# Patient Record
Sex: Female | Born: 1995 | Race: Black or African American | Hispanic: No | Marital: Single | Smoking: Current every day smoker
Health system: Southern US, Community
[De-identification: ages and names within clinical notes are randomized; demographics above are authoritative.]

## PROBLEM LIST (undated history)

## (undated) DIAGNOSIS — J45909 Unspecified asthma, uncomplicated: Secondary | ICD-10-CM

## (undated) DIAGNOSIS — R1115 Cyclical vomiting syndrome unrelated to migraine: Secondary | ICD-10-CM

## (undated) HISTORY — PX: FOOT SURGERY: SHX648

---
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
## 2017-03-27 ENCOUNTER — Emergency Department (HOSPITAL_BASED_OUTPATIENT_CLINIC_OR_DEPARTMENT_OTHER)
Admission: EM | Admit: 2017-03-27 | Discharge: 2017-03-27 | Disposition: A | Discharge status: Home / Self Care | Payer: Self-pay | Attending: Emergency Medicine | Admitting: Emergency Medicine

## 2017-03-27 ENCOUNTER — Other Ambulatory Visit: Payer: Self-pay

## 2017-03-27 ENCOUNTER — Encounter (HOSPITAL_BASED_OUTPATIENT_CLINIC_OR_DEPARTMENT_OTHER): Payer: Self-pay | Admitting: Emergency Medicine

## 2017-03-27 DIAGNOSIS — R1084 Generalized abdominal pain: Secondary | ICD-10-CM | POA: Insufficient documentation

## 2017-03-27 DIAGNOSIS — R112 Nausea with vomiting, unspecified: Secondary | ICD-10-CM | POA: Insufficient documentation

## 2017-03-27 DIAGNOSIS — F17290 Nicotine dependence, other tobacco product, uncomplicated: Secondary | ICD-10-CM | POA: Insufficient documentation

## 2017-03-27 DIAGNOSIS — J45909 Unspecified asthma, uncomplicated: Secondary | ICD-10-CM | POA: Insufficient documentation

## 2017-03-27 DIAGNOSIS — R109 Unspecified abdominal pain: Secondary | ICD-10-CM

## 2017-03-27 LAB — URINALYSIS, MICROSCOPIC (REFLEX)

## 2017-03-27 LAB — HCG, QUANTITATIVE, PREGNANCY: hCG, Beta Chain, Quant, S: <1 m[IU]/mL (ref <5)

## 2017-03-27 LAB — URINALYSIS, ROUTINE W REFLEX MICROSCOPIC
GLUCOSE, UA: NEGATIVE mg/dL
KETONES UR: 15 mg/dL — AB
Nitrite: NEGATIVE
PROTEIN: NEGATIVE mg/dL
Specific Gravity, Urine: >1.03 — ABNORMAL HIGH (ref 1.005–1.030)
pH: 5.5 (ref 5.0–8.0)

## 2017-03-27 LAB — COMPREHENSIVE METABOLIC PANEL
ALT: 15 U/L (ref 14–54)
AST: 25 U/L (ref 15–41)
Albumin: 4.1 g/dL (ref 3.5–5.0)
Alkaline Phosphatase: 56 U/L (ref 38–126)
Anion gap: 9 (ref 5–15)
BUN: 9 mg/dL (ref 6–20)
CHLORIDE: 107 mmol/L (ref 101–111)
CO2: 23 mmol/L (ref 22–32)
CREATININE: 0.83 mg/dL (ref 0.44–1.00)
Calcium: 9.9 mg/dL (ref 8.9–10.3)
GFR calc Af Amer: >60 mL/min (ref >60)
Glucose, Bld: 110 mg/dL — ABNORMAL HIGH (ref 65–99)
Potassium: 3.4 mmol/L — ABNORMAL LOW (ref 3.5–5.1)
SODIUM: 139 mmol/L (ref 135–145)
Total Bilirubin: 0.8 mg/dL (ref 0.3–1.2)
Total Protein: 8.3 g/dL — ABNORMAL HIGH (ref 6.5–8.1)

## 2017-03-27 LAB — CBC
HCT: 39.9 % (ref 36.0–46.0)
Hemoglobin: 13.2 g/dL (ref 12.0–15.0)
MCH: 28.4 pg (ref 26.0–34.0)
MCHC: 33.1 g/dL (ref 30.0–36.0)
MCV: 86 fL (ref 78.0–100.0)
PLATELETS: 324 10*3/uL (ref 150–400)
RBC: 4.64 MIL/uL (ref 3.87–5.11)
RDW: 12.9 % (ref 11.5–15.5)
WBC: 8.8 10*3/uL (ref 4.0–10.5)

## 2017-03-27 LAB — LIPASE, BLOOD: LIPASE: 36 U/L (ref 11–51)

## 2017-03-27 MED ORDER — ONDANSETRON 4 MG PO TBDP: 4.0000 mg | ORAL_TABLET | Freq: Once | ORAL | Status: DC | PRN

## 2017-03-27 MED ORDER — ONDANSETRON HCL 4 MG/2ML IJ SOLN
4.0000 mg | Freq: Once | INTRAMUSCULAR | Status: AC | PRN
  Administered 2017-03-27: 4 mg via INTRAVENOUS
  Filled 2017-03-27: qty 2

## 2017-03-27 MED ORDER — KETOROLAC TROMETHAMINE 30 MG/ML IJ SOLN
30.0000 mg | Freq: Once | INTRAMUSCULAR | Status: AC
  Administered 2017-03-27: 30 mg via INTRAVENOUS
  Filled 2017-03-27: qty 1

## 2017-03-27 MED ORDER — METOCLOPRAMIDE HCL 5 MG/ML IJ SOLN
5.0000 mg | Freq: Once | INTRAMUSCULAR | Status: AC
  Administered 2017-03-27: 5 mg via INTRAVENOUS
  Filled 2017-03-27: qty 2

## 2017-03-27 MED ORDER — ONDANSETRON 4 MG PO TBDP: 4.0000 mg | ORAL_TABLET | Freq: Three times a day (TID) | ORAL | 0 refills | Status: DC | PRN

## 2017-03-27 MED ORDER — HALOPERIDOL LACTATE 5 MG/ML IJ SOLN
1.0000 mg | Freq: Once | INTRAMUSCULAR | Status: AC
  Administered 2017-03-27: 1 mg via INTRAVENOUS
  Filled 2017-03-27: qty 1

## 2017-03-27 MED ORDER — NAPROXEN 500 MG PO TABS: 500.0000 mg | ORAL_TABLET | Freq: Two times a day (BID) | ORAL | 0 refills | Status: DC

## 2017-03-27 NOTE — ED Notes (Signed)
Spoke to lab will add culture

## 2017-03-27 NOTE — ED Provider Notes (Signed)
[facility] EMERGENCY DEPARTMENT Provider Note   CSN: [id number] Arrival date & time: 03/27/17  1319     History   Chief Complaint Chief Complaint  Patient presents with  . Abdominal Pain  . Emesis    HPI Sherry Powell is a 22 y.o. female with a past medical history of asthma, who presents to ED for evaluation of generalized abdominal pain, 10+ episodes of nausea and vomiting that began when she woke up this morning.  She did not take any medications at home to help with symptoms.  She took 3 pills of ecstasy last night at a party and is concerned that "I am going to die."  She reports cough which also causes her to be more nauseous.  She denies any urinary symptoms, bowel changes, vaginal discharge, abnormal vaginal bleeding or previous history of similar symptoms.  She denies any other drug use, alcohol or tobacco use.  HPI  Past Medical History:  Diagnosis Date  . Asthma     There are no active problems to display for this patient.   Past Surgical History:  Procedure Laterality Date  . FOOT SURGERY      OB History    Gravida Para Term Preterm AB Living   1             SAB TAB Ectopic Multiple Live Births                   Home Medications    Prior to Admission medications   Medication Sig Start Date End Date Taking? Authorizing Provider  cephALEXin (KEFLEX) 500 MG capsule Take 1 capsule (500 mg total) by mouth 2 (two) times daily. Patient not taking: Reported on 01/28/2016 01/25/15   Tilden Fossa, MD  cyclobenzaprine (FLEXERIL) 10 MG tablet Take 1 tablet (10 mg total) by mouth 2 (two) times daily as needed for muscle spasms. 01/29/16   Alvira Monday, MD  ibuprofen (ADVIL,MOTRIN) 400 MG tablet Take 1 tablet (400 mg total) by mouth every 6 (six) hours as needed. 01/29/16   Alvira Monday, MD  naproxen (NAPROSYN) 500 MG tablet Take 1 tablet (500 mg total) by mouth 2 (two) times daily. 03/27/17   Arda Keadle, PA-C  ondansetron (ZOFRAN ODT)  4 MG disintegrating tablet Take 1 tablet (4 mg total) by mouth every 8 (eight) hours as needed for nausea or vomiting. 03/27/17   Yarenis Cerino, PA-C  ondansetron (ZOFRAN) 4 MG tablet Take 1 tablet (4 mg total) by mouth every 6 (six) hours. Patient not taking: Reported on 01/28/2016 01/25/15   Tilden Fossa, MD    Family History No family history on file.  Social History Social History   Tobacco Use  . Smoking status: Current Every Day Smoker    Types: Cigars  . Smokeless tobacco: Never Used  Substance Use Topics  . Alcohol use: No  . Drug use: No     Allergies   Strawberry (diagnostic)   Review of Systems Review of Systems  Constitutional: Negative for appetite change, chills and fever.  HENT: Negative for ear pain, rhinorrhea, sneezing and sore throat.   Eyes: Negative for photophobia and visual disturbance.  Respiratory: Negative for cough, chest tightness, shortness of breath and wheezing.   Cardiovascular: Negative for chest pain and palpitations.  Gastrointestinal: Positive for abdominal pain, nausea and vomiting. Negative for blood in stool, constipation and diarrhea.  Genitourinary: Negative for dysuria, hematuria and urgency.  Musculoskeletal: Negative for myalgias.  Skin: Negative for rash.  Neurological:  Negative for dizziness, weakness and light-headedness.     Physical Exam Updated Vital Signs BP 105/78 (BP Location: Right Arm)   Pulse (!) 58   Temp 97.9 F (36.6 C) (Oral)   Resp 20   Ht 5\' 6"  (1.676 m)   Wt 77.1 kg (170 lb)   LMP 03/23/2017   SpO2 100%   Breastfeeding? No   BMI 27.44 kg/m   Physical Exam  Constitutional: She appears well-developed and well-nourished. No distress.  Patient very anxious concerned that she is going to die.  She is also tearful saying that "please put some more Zofran in my IV."  Rolling around on exam bed.  HENT:  Head: Normocephalic and atraumatic.  Nose: Nose normal.  Eyes: Conjunctivae and EOM are normal.  Left eye exhibits no discharge. No scleral icterus.  Neck: Normal range of motion. Neck supple.  Cardiovascular: Normal rate, regular rhythm, normal heart sounds and intact distal pulses. Exam reveals no gallop and no friction rub.  No murmur heard. Pulmonary/Chest: Effort normal and breath sounds normal. No respiratory distress.  Abdominal: Soft. Bowel sounds are normal. She exhibits no distension. There is no tenderness. There is no guarding.    Generalized abdominal tenderness to palpation.  Musculoskeletal: Normal range of motion. She exhibits no edema.  Neurological: She is alert. She exhibits normal muscle tone. Coordination normal.  Skin: Skin is warm and dry. No rash noted.  Psychiatric: She has a normal mood and affect.  Nursing note and vitals reviewed.    ED Treatments / Results  Labs (all labs ordered are listed, but only abnormal results are displayed) Labs Reviewed  COMPREHENSIVE METABOLIC PANEL - Abnormal; Notable for the following components:      Result Value   Potassium 3.4 (*)    Glucose, Bld 110 (*)    Total Protein 8.3 (*)    All other components within normal limits  URINALYSIS, ROUTINE W REFLEX MICROSCOPIC - Abnormal; Notable for the following components:   APPearance CLOUDY (*)    Specific Gravity, Urine >1.030 (*)    Hgb urine dipstick SMALL (*)    Bilirubin Urine SMALL (*)    Ketones, ur 15 (*)    Leukocytes, UA SMALL (*)    All other components within normal limits  URINALYSIS, MICROSCOPIC (REFLEX) - Abnormal; Notable for the following components:   Bacteria, UA MANY (*)    Squamous Epithelial / LPF 6-30 (*)    All other components within normal limits  URINE CULTURE  LIPASE, BLOOD  CBC  HCG, QUANTITATIVE, PREGNANCY    EKG  EKG Interpretation None       Radiology No results found.  Procedures Procedures (including critical care time)  Medications Ordered in ED Medications  ondansetron (ZOFRAN) injection 4 mg (4 mg Intravenous  Given 03/27/17 1340)  haloperidol lactate (HALDOL) injection 1 mg (1 mg Intravenous Given 03/27/17 1431)  metoCLOPramide (REGLAN) injection 5 mg (5 mg Intravenous Given 03/27/17 1500)  ketorolac (TORADOL) 30 MG/ML injection 30 mg (30 mg Intravenous Given 03/27/17 1634)     Initial Impression / Assessment and Plan / ED Course  I have reviewed the triage vital signs and the nursing notes.  Pertinent labs & imaging results that were available during my care of the patient were reviewed by me and considered in my medical decision making (see chart for details).  Clinical Course as of Mar 27 1701  Wynelle Link Mar 27, 2017  1652 Toradol given. Improvement in pain. Able to tolerate PO intake.  [  HK]    Clinical Course User Index [HK] Dietrich PatesKhatri, Jarius Dieudonne, PA-C    Patient presents to ED for evaluation of general morning.  She admits that she took 3 pills of ecstasy last night at a party and is concerned that "I am going to die."  She also reports cough which causes her to be more nauseous.  She denies any urinary symptoms, diarrhea or constipation, vaginal discharge previous history of similar symptoms.  She denies any other drug use.  On physical exam during my initial evaluation patient appeared extremely anxious and would not sit still.  She kept crying that she was concerned that she was going to die because of the drugs that she took last night.  She reports generalized abdominal tenderness to palpation.  However, her vital signs are reassuring.  She is afebrile and is not tachycardic or tachypneic.  Lab work is also reassuring including CBC, CMP, lipase.  HCG was negative.  Urinalysis with small leukocytes, many bacteria and 6-30 WBCs.  Patient denies any urinary symptoms at this time, so we will send for culture and refrain from antibiotic at this time.  Patient given Zofran initially for nausea.  She continued to dry heave and vomit.  She was then given Haldol and Reglan with improvement in her nausea.  She was given  Toradol with improvement in her abdominal pain.  It appears that the half-life of ecstasy is approximately 4-6 hours so I doubt that this is a reaction.  I think this is more due to her anxiety from taking the drugs last night and worrying that they would have a harmful effect on her body.  I did encourage her to discontinue any drug use in the future.  She is able to tolerate p.o. intake here in the ED. I have low suspicion for acute intraabdominal surgical or emergent cause of her symptoms. Will discharge with Zofran and naproxen to be taken as needed. Patient appears stable for discharge at this time. Strict return precautions given.  Final Clinical Impressions(s) / ED Diagnoses   Final diagnoses:  Abdominal wall pain  Non-intractable vomiting with nausea, unspecified vomiting type    ED Discharge Orders        Ordered    ondansetron (ZOFRAN ODT) 4 MG disintegrating tablet  Every 8 hours PRN     03/27/17 1702    naproxen (NAPROSYN) 500 MG tablet  2 times daily     03/27/17 1702     Portions of this note were generated with Dragon dictation software. Dictation errors may occur despite best attempts at proofreading.    Dietrich PatesKhatri, Yeva Bissette, PA-C 03/27/17 1710    Pricilla LovelessGoldston, Scott, MD 03/28/17 [id number]

## 2017-03-27 NOTE — ED Notes (Signed)
Pt given d/c instructions as per chart. Rx x 2. Verbalizes understanding. No questions. 

## 2017-03-27 NOTE — ED Notes (Addendum)
Hollering loudly, states," I'm going to die and I am so sick to my stomach" reassurance given, informed that ED provider would be in shortly to eval. Spent time at length to calm and reassure but informed that hollering loudly would not be tolerated in ED

## 2017-03-27 NOTE — ED Triage Notes (Signed)
Pt c/o abdominal pain with N/V onset this morning when she woke. Pt took 3 pills of ectasy last night.

## 2017-03-27 NOTE — Discharge Instructions (Signed)
Please read attached information regarding your condition. Take Zofran as needed for nausea. Take naproxen as needed for pain. We will contact you with the results of your urine culture when available. Return to ED for worsening symptoms, severe abdominal pain, increased vomiting, lightheadedness or loss of consciousness.

## 2017-03-29 LAB — URINE CULTURE

## 2017-06-14 ENCOUNTER — Encounter (HOSPITAL_BASED_OUTPATIENT_CLINIC_OR_DEPARTMENT_OTHER): Payer: Self-pay | Admitting: *Deleted

## 2017-06-14 ENCOUNTER — Emergency Department (HOSPITAL_BASED_OUTPATIENT_CLINIC_OR_DEPARTMENT_OTHER): Payer: Self-pay

## 2017-06-14 ENCOUNTER — Other Ambulatory Visit: Payer: Self-pay

## 2017-06-14 ENCOUNTER — Emergency Department (HOSPITAL_BASED_OUTPATIENT_CLINIC_OR_DEPARTMENT_OTHER)
Admission: EM | Admit: 2017-06-14 | Discharge: 2017-06-14 | Discharge status: Against Medical Advice | Payer: Self-pay | Attending: Emergency Medicine | Admitting: Emergency Medicine

## 2017-06-14 DIAGNOSIS — F17290 Nicotine dependence, other tobacco product, uncomplicated: Secondary | ICD-10-CM | POA: Insufficient documentation

## 2017-06-14 DIAGNOSIS — K529 Noninfective gastroenteritis and colitis, unspecified: Secondary | ICD-10-CM | POA: Insufficient documentation

## 2017-06-14 DIAGNOSIS — J45909 Unspecified asthma, uncomplicated: Secondary | ICD-10-CM | POA: Insufficient documentation

## 2017-06-14 LAB — COMPREHENSIVE METABOLIC PANEL
ALBUMIN: 4 g/dL (ref 3.5–5.0)
ALT: 10 U/L — ABNORMAL LOW (ref 14–54)
AST: 18 U/L (ref 15–41)
Alkaline Phosphatase: 58 U/L (ref 38–126)
Anion gap: 9 (ref 5–15)
BUN: 10 mg/dL (ref 6–20)
CO2: 21 mmol/L — AB (ref 22–32)
Calcium: 9.1 mg/dL (ref 8.9–10.3)
Chloride: 107 mmol/L (ref 101–111)
Creatinine, Ser: 0.82 mg/dL (ref 0.44–1.00)
GFR calc Af Amer: >60 mL/min (ref >60)
GFR calc non Af Amer: >60 mL/min (ref >60)
GLUCOSE: 102 mg/dL — AB (ref 65–99)
POTASSIUM: 3.9 mmol/L (ref 3.5–5.1)
SODIUM: 137 mmol/L (ref 135–145)
Total Bilirubin: 0.5 mg/dL (ref 0.3–1.2)
Total Protein: 7.7 g/dL (ref 6.5–8.1)

## 2017-06-14 LAB — PREGNANCY, URINE: Preg Test, Ur: NEGATIVE

## 2017-06-14 LAB — CBC
HCT: 38.6 % (ref 36.0–46.0)
Hemoglobin: 12.9 g/dL (ref 12.0–15.0)
MCH: 29.3 pg (ref 26.0–34.0)
MCHC: 33.4 g/dL (ref 30.0–36.0)
MCV: 87.7 fL (ref 78.0–100.0)
Platelets: 272 10*3/uL (ref 150–400)
RBC: 4.4 MIL/uL (ref 3.87–5.11)
RDW: 13.1 % (ref 11.5–15.5)
WBC: 11.1 10*3/uL — AB (ref 4.0–10.5)

## 2017-06-14 LAB — URINALYSIS, ROUTINE W REFLEX MICROSCOPIC
Bilirubin Urine: NEGATIVE
Glucose, UA: NEGATIVE mg/dL
Hgb urine dipstick: NEGATIVE
KETONES UR: NEGATIVE mg/dL
Nitrite: NEGATIVE
Protein, ur: NEGATIVE mg/dL
Specific Gravity, Urine: >1.03 — ABNORMAL HIGH (ref 1.005–1.030)
pH: 6 (ref 5.0–8.0)

## 2017-06-14 LAB — LIPASE, BLOOD: LIPASE: 47 U/L (ref 11–51)

## 2017-06-14 LAB — URINALYSIS, MICROSCOPIC (REFLEX)

## 2017-06-14 MED ORDER — IOPAMIDOL (ISOVUE-300) INJECTION 61%
100.0000 mL | Freq: Once | INTRAVENOUS | Status: AC | PRN
  Administered 2017-06-14: 100 mL via INTRAVENOUS

## 2017-06-14 MED ORDER — PROMETHAZINE HCL 25 MG/ML IJ SOLN: 25.0000 mg | Freq: Once | INTRAMUSCULAR | Status: DC

## 2017-06-14 MED ORDER — ONDANSETRON HCL 4 MG/2ML IJ SOLN
INTRAMUSCULAR | Status: AC
  Administered 2017-06-14: 4 mg
  Filled 2017-06-14: qty 2

## 2017-06-14 MED ORDER — SODIUM CHLORIDE 0.9 % IV BOLUS: 1000.0000 mL | Freq: Once | INTRAVENOUS | Status: DC

## 2017-06-14 MED ORDER — ONDANSETRON 4 MG PO TBDP
4.0000 mg | ORAL_TABLET | Freq: Once | ORAL | Status: DC | PRN
  Filled 2017-06-14: qty 1

## 2017-06-14 NOTE — ED Triage Notes (Signed)
Abdominal pain woke her at 6am. Vomiting. She is pacing and crying. Does not know if she is pregnant.

## 2017-06-14 NOTE — ED Notes (Addendum)
Received call from registration, patient was seen leaving the building and getting into her car.  Notified EDP

## 2017-06-14 NOTE — ED Provider Notes (Signed)
[facility] EMERGENCY DEPARTMENT Provider Note   CSN: [id number] Arrival date & time: 06/14/17  1139     History   Chief Complaint Chief Complaint  Patient presents with  . Abdominal Pain  . Emesis    HPI Neela Zecca is a 22 y.o. female history of asthma here presenting with abdominal pain, vomiting.  Patient woke up around 6 AM with several episodes of vomiting.  Denies any diarrhea, does have some more right-sided abdominal pain as well.  Denies any recent travel or sick contacts.  The history is provided by the patient.    Past Medical History:  Diagnosis Date  . Asthma     There are no active problems to display for this patient.   Past Surgical History:  Procedure Laterality Date  . FOOT SURGERY       OB History    Gravida  1   Para      Term      Preterm      AB      Living        SAB      TAB      Ectopic      Multiple      Live Births               Home Medications    Prior to Admission medications   Medication Sig Start Date End Date Taking? Authorizing Provider  cephALEXin (KEFLEX) 500 MG capsule Take 1 capsule (500 mg total) by mouth 2 (two) times daily. Patient not taking: Reported on 01/28/2016 01/25/15   Tilden Fossa, MD  cyclobenzaprine (FLEXERIL) 10 MG tablet Take 1 tablet (10 mg total) by mouth 2 (two) times daily as needed for muscle spasms. 01/29/16   Alvira Monday, MD  ibuprofen (ADVIL,MOTRIN) 400 MG tablet Take 1 tablet (400 mg total) by mouth every 6 (six) hours as needed. 01/29/16   Alvira Monday, MD  naproxen (NAPROSYN) 500 MG tablet Take 1 tablet (500 mg total) by mouth 2 (two) times daily. 03/27/17   Khatri, Hina, PA-C  ondansetron (ZOFRAN ODT) 4 MG disintegrating tablet Take 1 tablet (4 mg total) by mouth every 8 (eight) hours as needed for nausea or vomiting. 03/27/17   Khatri, Hina, PA-C  ondansetron (ZOFRAN) 4 MG tablet Take 1 tablet (4 mg total) by mouth every 6 (six) hours. Patient  not taking: Reported on 01/28/2016 01/25/15   Tilden Fossa, MD    Family History No family history on file.  Social History Social History   Tobacco Use  . Smoking status: Current Every Day Smoker    Types: Cigars  . Smokeless tobacco: Never Used  Substance Use Topics  . Alcohol use: No  . Drug use: No     Allergies   Strawberry (diagnostic)   Review of Systems Review of Systems  Gastrointestinal: Positive for abdominal pain and vomiting.  All other systems reviewed and are negative.    Physical Exam Updated Vital Signs BP 108/68 (BP Location: Left Arm)   Pulse 64   Temp 98 F (36.7 C) (Oral)   Resp 16   Ht 5\' 6"  (1.676 m)   Wt 79.4 kg (175 lb)   LMP 05/18/2017   SpO2 100%   BMI 28.25 kg/m   Physical Exam  Constitutional:  Slightly dehydrated   HENT:  Head: Normocephalic.  MM slightly dry   Eyes: Pupils are equal, round, and reactive to light. EOM are normal.  Cardiovascular: Normal rate.  Pulmonary/Chest: Effort normal.  Abdominal: Normal appearance and bowel sounds are normal. There is tenderness in the right lower quadrant.  Neurological: She is alert.  Skin: Skin is warm. Capillary refill takes less than 2 seconds.  Psychiatric: She has a normal mood and affect.  Nursing note and vitals reviewed.    ED Treatments / Results  Labs (all labs ordered are listed, but only abnormal results are displayed) Labs Reviewed  COMPREHENSIVE METABOLIC PANEL - Abnormal; Notable for the following components:      Result Value   CO2 21 (*)    Glucose, Bld 102 (*)    ALT 10 (*)    All other components within normal limits  CBC - Abnormal; Notable for the following components:   WBC 11.1 (*)    All other components within normal limits  URINALYSIS, ROUTINE W REFLEX MICROSCOPIC - Abnormal; Notable for the following components:   APPearance HAZY (*)    Specific Gravity, Urine >1.030 (*)    Leukocytes, UA TRACE (*)    All other components within normal  limits  URINALYSIS, MICROSCOPIC (REFLEX) - Abnormal; Notable for the following components:   Bacteria, UA MANY (*)    Squamous Epithelial / LPF 6-30 (*)    All other components within normal limits  LIPASE, BLOOD  PREGNANCY, URINE    EKG None  Radiology Ct Abdomen Pelvis W Contrast  Result Date: 06/14/2017 CLINICAL DATA:  Abdominal pain acute general. EXAM: CT ABDOMEN AND PELVIS WITH CONTRAST TECHNIQUE: Multidetector CT imaging of the abdomen and pelvis was performed using the standard protocol following bolus administration of intravenous contrast. CONTRAST:  ISOVUE-300 IOPAMIDOL (ISOVUE-300) INJECTION 61% COMPARISON:  CT and pelvis 07/20/2016 FINDINGS: Lower chest: The lung bases are clear without airspace disease. Heart size is normal. No pleural or pericardial effusion is present. Hepatobiliary: No focal liver abnormality is seen. No gallstones, gallbladder wall thickening, or biliary dilatation. Pancreas: Unremarkable. No pancreatic ductal dilatation or surrounding inflammatory changes. Spleen: Normal in size without focal abnormality. Adrenals/Urinary Tract: Adrenal glands are normal bilaterally. Kidneys and ureters are within normal limits. The urinary bladder. Stomach/Bowel: A small hiatal hernia is present. Stomach and duodenum are otherwise within normal limits. Proximal small bowel is within normal limits. There may be some wall thickening in the distal terminal ileum. Appendix is visualized and normal. The cecum is within normal limits. There is wall thickening at hepatic flexure. No discrete mass is present. The more distal colon collapsed. Vascular/Lymphatic: Mesenteric lymph nodes are mildly prominent. There are no pathologically enlarged nodes. Significant vascular lesions are present. Reproductive: Dominant follicle is present in the left ovary. Retroverted uterus demonstrates some edema probable fluid in the endometrial canal. Please correlate with menstrual cycle. Other: Small  amount free fluid within the anatomic pelvis likely physiologic. Musculoskeletal: Bone windows are unremarkable. IMPRESSION: 1. Nonspecific wall thickening in the terminal ileum as well as the hepatic flexure of the colon raises possibility of inflammatory bowel disease. Nonspecific enteritis is also considered. Mesenteric lymph nodes compatible with inflammatory process. No discrete mass lesion or obstruction is present. 2. Fluid in the endometrial canal, prominent follicle, and free fluid suggest late menstrual cycle findings. Please correlate clinically. 3. No other acute or focal lesion to explain patient's symptoms. Electronically Signed   By: Marin Roberts M.D.   On: 06/14/2017 15:03    Procedures Procedures (including critical care time)  Medications Ordered in ED Medications  ondansetron (ZOFRAN-ODT) disintegrating tablet 4 mg (has no administration in time range)  sodium chloride 0.9 % bolus 1,000 mL (1,000 mLs Intravenous Refused 06/14/17 1406)  ondansetron (ZOFRAN) 4 MG/2ML injection (4 mg  Given 06/14/17 1239)  iopamidol (ISOVUE-300) 61 % injection 100 mL (100 mLs Intravenous Contrast Given 06/14/17 1348)     Initial Impression / Assessment and Plan / ED Course  I have reviewed the triage vital signs and the nursing notes.  Pertinent labs & imaging results that were available during my care of the patient were reviewed by me and considered in my medical decision making (see chart for details).    Tim LairStephanie Oberman is a 22 y.o. female here with RLQ pain, vomiting. Afebrile but has focal RLQ tenderness. Consider appy vs gastroenteritis. No pelvic discharge to suggest tubo ovarian abscess or torsion. Will get labs, UA, CT ab/pel.   2:20 pm Patient was seen by registration leaving the premise. Labs showed WBC 11. CT ab/pel results pending.   3:35 PM I followed up CT that showed possible enteritis vs IBS. I suspect likely enteritis which is likely viral. I do recommend GI follow  up if she returns to the ED for similar symptoms in the future. At this point, she has no further workup needed.    Final Clinical Impressions(s) / ED Diagnoses   Final diagnoses:  Gastroenteritis    ED Discharge Orders    None       Charlynne PanderYao, David Hsienta, MD 06/14/17 1537

## 2018-01-01 ENCOUNTER — Emergency Department (HOSPITAL_BASED_OUTPATIENT_CLINIC_OR_DEPARTMENT_OTHER)
Admission: EM | Admit: 2018-01-01 | Discharge: 2018-01-01 | Disposition: A | Discharge status: Home / Self Care | Payer: Self-pay | Attending: Emergency Medicine | Admitting: Emergency Medicine

## 2018-01-01 ENCOUNTER — Other Ambulatory Visit: Payer: Self-pay

## 2018-01-01 ENCOUNTER — Encounter (HOSPITAL_BASED_OUTPATIENT_CLINIC_OR_DEPARTMENT_OTHER): Payer: Self-pay | Admitting: Emergency Medicine

## 2018-01-01 DIAGNOSIS — F17210 Nicotine dependence, cigarettes, uncomplicated: Secondary | ICD-10-CM | POA: Insufficient documentation

## 2018-01-01 DIAGNOSIS — Z79899 Other long term (current) drug therapy: Secondary | ICD-10-CM | POA: Insufficient documentation

## 2018-01-01 DIAGNOSIS — R112 Nausea with vomiting, unspecified: Secondary | ICD-10-CM | POA: Insufficient documentation

## 2018-01-01 DIAGNOSIS — J45909 Unspecified asthma, uncomplicated: Secondary | ICD-10-CM | POA: Insufficient documentation

## 2018-01-01 MED ORDER — FAMOTIDINE IN NACL 20-0.9 MG/50ML-% IV SOLN
20.0000 mg | Freq: Once | INTRAVENOUS | Status: AC
  Administered 2018-01-01: 20 mg via INTRAVENOUS
  Filled 2018-01-01: qty 50

## 2018-01-01 MED ORDER — DIPHENHYDRAMINE HCL 50 MG/ML IJ SOLN
25.0000 mg | Freq: Once | INTRAMUSCULAR | Status: AC
  Administered 2018-01-01: 25 mg via INTRAVENOUS
  Filled 2018-01-01: qty 1

## 2018-01-01 MED ORDER — MORPHINE SULFATE (PF) 2 MG/ML IV SOLN
1.0000 mg | Freq: Once | INTRAVENOUS | Status: AC
  Administered 2018-01-01: 1 mg via INTRAVENOUS
  Filled 2018-01-01: qty 1

## 2018-01-01 MED ORDER — SODIUM CHLORIDE 0.9 % IV BOLUS
1000.0000 mL | Freq: Once | INTRAVENOUS | Status: AC
  Administered 2018-01-01: 1000 mL via INTRAVENOUS

## 2018-01-01 MED ORDER — ONDANSETRON HCL 4 MG PO TABS: 4.0000 mg | ORAL_TABLET | Freq: Three times a day (TID) | ORAL | 0 refills | Status: AC | PRN

## 2018-01-01 MED ORDER — ONDANSETRON HCL 4 MG/2ML IJ SOLN
4.0000 mg | Freq: Once | INTRAMUSCULAR | Status: AC
  Administered 2018-01-01: 4 mg via INTRAVENOUS
  Filled 2018-01-01: qty 2

## 2018-01-01 MED ORDER — ONDANSETRON HCL 4 MG/2ML IJ SOLN
INTRAMUSCULAR | Status: AC
  Filled 2018-01-01: qty 2

## 2018-01-01 MED ORDER — METOCLOPRAMIDE HCL 5 MG/ML IJ SOLN
10.0000 mg | Freq: Once | INTRAMUSCULAR | Status: AC
  Administered 2018-01-01: 10 mg via INTRAVENOUS
  Filled 2018-01-01: qty 2

## 2018-01-01 MED ORDER — SODIUM CHLORIDE 0.9 % IV SOLN: INTRAVENOUS | Status: DC | PRN

## 2018-01-01 MED ORDER — ONDANSETRON HCL 4 MG/2ML IJ SOLN
4.0000 mg | Freq: Once | INTRAMUSCULAR | Status: AC
  Administered 2018-01-01: 4 mg via INTRAVENOUS

## 2018-01-01 NOTE — ED Notes (Signed)
Patient begin to vomit after using the restroom.

## 2018-01-01 NOTE — ED Provider Notes (Signed)
[facility] EMERGENCY DEPARTMENT Provider Note  CSN: [id number] Arrival date & time: 01/01/18  1217  History   Chief Complaint Chief Complaint  Patient presents with  . Emesis    HPI Sherry Powell is a 22 y.o. female with a medical history of asthma who presented to the ED for   Emesis   This is a new problem. The current episode started yesterday. The problem occurs 5 to 10 times per day. The problem has not changed since onset.The emesis has an appearance of stomach contents and bilious material. There has been no fever. Pertinent negatives include no abdominal pain, no arthralgias, no chills, no diarrhea, no fever, no headaches, no myalgias, no sweats and no URI. Risk factors: Occurs in the context of excess EtOH use last night.    Past Medical History:  Diagnosis Date  . Asthma     There are no active problems to display for this patient.   Past Surgical History:  Procedure Laterality Date  . FOOT SURGERY       OB History    Gravida  1   Para      Term      Preterm      AB      Living        SAB      TAB      Ectopic      Multiple      Live Births               Home Medications    Prior to Admission medications   Medication Sig Start Date End Date Taking? Authorizing Provider  cephALEXin (KEFLEX) 500 MG capsule Take 1 capsule (500 mg total) by mouth 2 (two) times daily. Patient not taking: Reported on 01/28/2016 01/25/15   Tilden Fossa, MD  cyclobenzaprine (FLEXERIL) 10 MG tablet Take 1 tablet (10 mg total) by mouth 2 (two) times daily as needed for muscle spasms. 01/29/16   Alvira Monday, MD  ibuprofen (ADVIL,MOTRIN) 400 MG tablet Take 1 tablet (400 mg total) by mouth every 6 (six) hours as needed. 01/29/16   Alvira Monday, MD  naproxen (NAPROSYN) 500 MG tablet Take 1 tablet (500 mg total) by mouth 2 (two) times daily. 03/27/17   Khatri, Hina, PA-C  ondansetron (ZOFRAN ODT) 4 MG disintegrating tablet Take 1 tablet  (4 mg total) by mouth every 8 (eight) hours as needed for nausea or vomiting. 03/27/17   Khatri, Hina, PA-C  ondansetron (ZOFRAN) 4 MG tablet Take 1 tablet (4 mg total) by mouth every 6 (six) hours. Patient not taking: Reported on 01/28/2016 01/25/15   Tilden Fossa, MD    Family History No family history on file.  Social History Social History   Tobacco Use  . Smoking status: Current Every Day Smoker    Types: Cigars  . Smokeless tobacco: Never Used  Substance Use Topics  . Alcohol use: Yes  . Drug use: No     Allergies   Strawberry (diagnostic)   Review of Systems Review of Systems  Constitutional: Negative for chills and fever.  Gastrointestinal: Positive for vomiting. Negative for abdominal pain and diarrhea.  Genitourinary: Negative.   Musculoskeletal: Negative for arthralgias and myalgias.  Skin: Negative.   Neurological: Negative for headaches.   Physical Exam Updated Vital Signs BP 113/74 (BP Location: Right Arm)   Pulse 91   Temp 98.1 F (36.7 C)   Resp (!) 22   Ht 5\' 6"  (1.676 m)  Wt 84.4 kg   LMP 12/28/2017   SpO2 100%   BMI 30.02 kg/m   Physical Exam  Constitutional: She appears well-developed and well-nourished. No distress.  HENT:  Mouth/Throat: Uvula is midline, oropharynx is clear and moist and mucous membranes are normal.  Eyes: Pupils are equal, round, and reactive to light. Conjunctivae, EOM and lids are normal.  Cardiovascular: Normal rate, regular rhythm and normal heart sounds.  No murmur heard. Pulmonary/Chest: Effort normal and breath sounds normal.  Abdominal: Soft. Normal appearance and bowel sounds are normal. There is no tenderness.  Musculoskeletal: Normal range of motion.  Skin: Skin is warm and intact. Capillary refill takes less than 2 seconds.  Nursing note and vitals reviewed.  ED Treatments / Results  Labs (all labs ordered are listed, but only abnormal results are displayed) Labs Reviewed - No data to  display  EKG None  Radiology No results found.  Procedures Procedures (including critical care time)  Medications Ordered in ED Medications  sodium chloride 0.9 % bolus 1,000 mL (has no administration in time range)  ondansetron (ZOFRAN) injection 4 mg (has no administration in time range)   Initial Impression / Assessment and Plan / ED Course  Triage vital signs and the nursing notes have been reviewed.  Pertinent labs & imaging results that were available during care of the patient were reviewed and considered in medical decision making (see chart for details).  Patient presents to the ED afebrile for nausea and vomiting which occurs in the context of excessive EtOH consumption the prior night. Physical exam unremarkable except for some very mild tenderness with abdominal palpation. No findings that raises concern for an acute abdomen that warrants further evaluation currently. Will begin IV hydration and antiemetic for symptom management.     Final Clinical Impressions(s) / ED Diagnoses  1. Nausea/Vomiting. Nausea controlled and alleviated with IV fluids and a combination of anti-emetics including Benadryl, Zofran and Reglan. Education provided on home care following EtOH intoxication. Rx for Zofran prescribed.   Dispo: Home. After thorough clinical evaluation, this patient is determined to be medically stable and can be safely discharged with the previously mentioned treatment and/or outpatient follow-up/referral(s). At this time, there are no other apparent medical conditions that require further screening, evaluation or treatment.  Final diagnoses:  Non-intractable vomiting with nausea, unspecified vomiting type    ED Discharge Orders    None        Windy Carina, [place] 01/01/18 1458    Long, Arlyss Repress, MD 01/02/18 [phone]

## 2018-01-01 NOTE — Discharge Instructions (Addendum)
It is very important that you stay hydrated. You need to drink lots of water. I have prescribed you Zofran that you may use at home for your nausea.  Be safe and take care of yourself.

## 2018-01-01 NOTE — ED Notes (Signed)
Pt states needs to rest for another 15 minutes before discharge. Friend in room to drive

## 2018-01-01 NOTE — ED Notes (Signed)
ED Provider at bedside. 

## 2018-01-01 NOTE — ED Triage Notes (Addendum)
Pt c/o vomiting since last night. Pt was drinking alcohol yesterday.

## 2018-07-31 ENCOUNTER — Emergency Department (HOSPITAL_BASED_OUTPATIENT_CLINIC_OR_DEPARTMENT_OTHER)
Admission: EM | Admit: 2018-07-31 | Discharge: 2018-07-31 | Disposition: A | Discharge status: Home / Self Care | Payer: Self-pay | Attending: Emergency Medicine | Admitting: Emergency Medicine

## 2018-07-31 ENCOUNTER — Encounter (HOSPITAL_BASED_OUTPATIENT_CLINIC_OR_DEPARTMENT_OTHER): Payer: Self-pay

## 2018-07-31 ENCOUNTER — Other Ambulatory Visit: Payer: Self-pay

## 2018-07-31 DIAGNOSIS — F17210 Nicotine dependence, cigarettes, uncomplicated: Secondary | ICD-10-CM | POA: Insufficient documentation

## 2018-07-31 DIAGNOSIS — R1115 Cyclical vomiting syndrome unrelated to migraine: Secondary | ICD-10-CM | POA: Insufficient documentation

## 2018-07-31 DIAGNOSIS — J45909 Unspecified asthma, uncomplicated: Secondary | ICD-10-CM | POA: Insufficient documentation

## 2018-07-31 DIAGNOSIS — Z79899 Other long term (current) drug therapy: Secondary | ICD-10-CM | POA: Insufficient documentation

## 2018-07-31 LAB — COMPREHENSIVE METABOLIC PANEL
ALT: 13 U/L (ref 0–44)
AST: 20 U/L (ref 15–41)
Albumin: 4.2 g/dL (ref 3.5–5.0)
Alkaline Phosphatase: 52 U/L (ref 38–126)
Anion gap: 9 (ref 5–15)
BUN: 9 mg/dL (ref 6–20)
CO2: 22 mmol/L (ref 22–32)
Calcium: 9.4 mg/dL (ref 8.9–10.3)
Chloride: 108 mmol/L (ref 98–111)
Creatinine, Ser: 0.83 mg/dL (ref 0.44–1.00)
GFR calc Af Amer: >60 mL/min (ref >60)
GFR calc non Af Amer: >60 mL/min (ref >60)
Glucose, Bld: 109 mg/dL — ABNORMAL HIGH (ref 70–99)
Potassium: 3.4 mmol/L — ABNORMAL LOW (ref 3.5–5.1)
Sodium: 139 mmol/L (ref 135–145)
Total Bilirubin: 0.4 mg/dL (ref 0.3–1.2)
Total Protein: 7.8 g/dL (ref 6.5–8.1)

## 2018-07-31 LAB — CBC WITH DIFFERENTIAL/PLATELET
Abs Immature Granulocytes: 0.05 10*3/uL (ref 0.00–0.07)
Basophils Absolute: 0.1 10*3/uL (ref 0.0–0.1)
Basophils Relative: 0 %
Eosinophils Absolute: 0.4 10*3/uL (ref 0.0–0.5)
Eosinophils Relative: 3 %
HCT: 39.8 % (ref 36.0–46.0)
Hemoglobin: 12.1 g/dL (ref 12.0–15.0)
Immature Granulocytes: 0 %
Lymphocytes Relative: 25 %
Lymphs Abs: 3 10*3/uL (ref 0.7–4.0)
MCH: 27.8 pg (ref 26.0–34.0)
MCHC: 30.4 g/dL (ref 30.0–36.0)
MCV: 91.3 fL (ref 80.0–100.0)
Monocytes Absolute: 0.9 10*3/uL (ref 0.1–1.0)
Monocytes Relative: 8 %
Neutro Abs: 7.4 10*3/uL (ref 1.7–7.7)
Neutrophils Relative %: 64 %
Platelets: 327 10*3/uL (ref 150–400)
RBC: 4.36 MIL/uL (ref 3.87–5.11)
RDW: 13.3 % (ref 11.5–15.5)
WBC: 11.7 10*3/uL — ABNORMAL HIGH (ref 4.0–10.5)
nRBC: 0 % (ref 0.0–0.2)

## 2018-07-31 LAB — URINALYSIS, ROUTINE W REFLEX MICROSCOPIC
Bilirubin Urine: NEGATIVE
Glucose, UA: NEGATIVE mg/dL
Hgb urine dipstick: NEGATIVE
Ketones, ur: NEGATIVE mg/dL
Leukocytes,Ua: NEGATIVE
Nitrite: NEGATIVE
Protein, ur: NEGATIVE mg/dL
Specific Gravity, Urine: 1.02 (ref 1.005–1.030)
pH: 7 (ref 5.0–8.0)

## 2018-07-31 LAB — LIPASE, BLOOD: Lipase: 51 U/L (ref 11–51)

## 2018-07-31 LAB — PREGNANCY, URINE: Preg Test, Ur: NEGATIVE

## 2018-07-31 MED ORDER — CAPSAICIN 0.075 % EX CREA
TOPICAL_CREAM | Freq: Once | CUTANEOUS | Status: AC
  Administered 2018-07-31: 16:00:00 via TOPICAL
  Filled 2018-07-31: qty 60

## 2018-07-31 MED ORDER — SODIUM CHLORIDE 0.9 % IV BOLUS
1000.0000 mL | Freq: Once | INTRAVENOUS | Status: AC
  Administered 2018-07-31: 1000 mL via INTRAVENOUS

## 2018-07-31 MED ORDER — METOCLOPRAMIDE HCL 5 MG/ML IJ SOLN
10.0000 mg | Freq: Once | INTRAMUSCULAR | Status: AC
  Administered 2018-07-31: 12:00:00 10 mg via INTRAVENOUS
  Filled 2018-07-31: qty 2

## 2018-07-31 MED ORDER — ONDANSETRON HCL 4 MG/2ML IJ SOLN
4.0000 mg | Freq: Once | INTRAMUSCULAR | Status: AC
  Administered 2018-07-31: 4 mg via INTRAVENOUS

## 2018-07-31 MED ORDER — ONDANSETRON HCL 4 MG/2ML IJ SOLN
INTRAMUSCULAR | Status: AC
  Administered 2018-07-31: 4 mg via INTRAVENOUS
  Filled 2018-07-31: qty 2

## 2018-07-31 MED ORDER — ONDANSETRON HCL 4 MG/2ML IJ SOLN
4.0000 mg | Freq: Once | INTRAMUSCULAR | Status: AC
  Administered 2018-07-31: 16:00:00 4 mg via INTRAVENOUS
  Filled 2018-07-31: qty 2

## 2018-07-31 MED ORDER — DICYCLOMINE HCL 10 MG/ML IM SOLN
20.0000 mg | Freq: Once | INTRAMUSCULAR | Status: DC
  Filled 2018-07-31: qty 2

## 2018-07-31 MED ORDER — FAMOTIDINE IN NACL 20-0.9 MG/50ML-% IV SOLN
20.0000 mg | Freq: Once | INTRAVENOUS | Status: AC
  Administered 2018-07-31: 12:00:00 20 mg via INTRAVENOUS
  Filled 2018-07-31: qty 50

## 2018-07-31 MED ORDER — DIPHENHYDRAMINE HCL 50 MG/ML IJ SOLN
25.0000 mg | Freq: Once | INTRAMUSCULAR | Status: AC
Start: 2018-07-31 — End: 2018-07-31
  Administered 2018-07-31: 12:00:00 25 mg via INTRAVENOUS
  Filled 2018-07-31: qty 1

## 2018-07-31 MED ORDER — HALOPERIDOL LACTATE 5 MG/ML IJ SOLN
2.5000 mg | Freq: Once | INTRAMUSCULAR | Status: AC
  Administered 2018-07-31: 13:00:00 2.5 mg via INTRAVENOUS
  Filled 2018-07-31: qty 1

## 2018-07-31 NOTE — ED Notes (Signed)
Pt on monitor 

## 2018-07-31 NOTE — ED Triage Notes (Signed)
Pt states awoke today 6am nauseous, vomiting.  Denies recent illness or fever, does report abdominal cramping with vomiting.

## 2018-07-31 NOTE — ED Notes (Signed)
Pt continues to report intermittent nausea/vomiting, provider aware, see additional orders.

## 2018-07-31 NOTE — ED Notes (Signed)
Pt resting with eyes closed.

## 2018-07-31 NOTE — ED Notes (Signed)
Pt provided ginger ale, took a few sips, states starting to feel better.  PO challenge in progress

## 2018-07-31 NOTE — ED Notes (Signed)
Pt seen by staff walked out of ambulance bay door.  Would not return to complete discharge process.  Pt removed her own IV prior to leaving room.

## 2018-07-31 NOTE — ED Notes (Signed)
Urine specimen sent, awaiting result.  Pt reports nausea returned.  Provider made aware.

## 2018-07-31 NOTE — ED Notes (Signed)
Pt continues to report no relief from ordered medications, nausea continues.

## 2018-07-31 NOTE — ED Provider Notes (Signed)
[facility] EMERGENCY DEPARTMENT Provider Note   CSN: [id number] Arrival date & time: 07/31/18  1055    History   Chief Complaint Chief Complaint  Patient presents with   Emesis    HPI Sherry Powell is a 23 y.o. female.     Sherry Powell is a 23 y.o. female with hx of asthma, presents for evaluation of nausea and vomiting. Patient reports symptoms began at 6 AM and she has had persistent nausea and vomiting since then and has been unable to stop.  She reports some associated abdominal cramping just prior to vomiting but no persistent abdominal pain.  No associated fevers or chills.  Normal bowel movements, no diarrhea or constipation.  No associated dysuria or urinary frequency.  No vaginal bleeding or vaginal discharge.  No associated chest pain, shortness of breath or cough.  Patient initially denies any drug use or recent alcohol use but then reports marijuana use.  Chart review reveals history of similar visits in the past, with diagnosis of cyclic vomiting syndrome.     Past Medical History:  Diagnosis Date   Asthma     There are no active problems to display for this patient.   Past Surgical History:  Procedure Laterality Date   FOOT SURGERY       OB History    Gravida  1   Para      Term      Preterm      AB      Living        SAB      TAB      Ectopic      Multiple      Live Births               Home Medications    Prior to Admission medications   Medication Sig Start Date End Date Taking? Authorizing Provider  cephALEXin (KEFLEX) 500 MG capsule Take 1 capsule (500 mg total) by mouth 2 (two) times daily. Patient not taking: Reported on 01/28/2016 01/25/15   Tilden Fossa, MD  cyclobenzaprine (FLEXERIL) 10 MG tablet Take 1 tablet (10 mg total) by mouth 2 (two) times daily as needed for muscle spasms. 01/29/16   Alvira Monday, MD  ibuprofen (ADVIL,MOTRIN) 400 MG tablet Take 1 tablet (400 mg total) by  mouth every 6 (six) hours as needed. 01/29/16   Alvira Monday, MD  naproxen (NAPROSYN) 500 MG tablet Take 1 tablet (500 mg total) by mouth 2 (two) times daily. 03/27/17   Khatri, Hina, PA-C  ondansetron (ZOFRAN ODT) 4 MG disintegrating tablet Take 1 tablet (4 mg total) by mouth every 8 (eight) hours as needed for nausea or vomiting. 03/27/17   Dietrich Pates, PA-C    Family History History reviewed. No pertinent family history.  Social History Social History   Tobacco Use   Smoking status: Current Every Day Smoker    Types: Cigars   Smokeless tobacco: Never Used  Substance Use Topics   Alcohol use: Yes   Drug use: No     Allergies   Strawberry (diagnostic)   Review of Systems Review of Systems  Constitutional: Negative for chills and fever.  HENT: Negative.   Eyes: Negative for visual disturbance.  Respiratory: Negative for cough and shortness of breath.   Cardiovascular: Negative for chest pain.  Gastrointestinal: Positive for abdominal pain, nausea and vomiting. Negative for blood in stool, constipation and diarrhea.  Genitourinary: Negative for dysuria, frequency, vaginal bleeding and vaginal discharge.  Musculoskeletal: Negative for arthralgias and myalgias.  Skin: Negative for color change and rash.  Neurological: Negative for dizziness, syncope and light-headedness.     Physical Exam Updated Vital Signs BP (!) 150/102 (BP Location: Left Arm)    Pulse (!) 101    Temp 97.9 F (36.6 C) (Oral)    Resp 20    Ht 5\' 6"  (1.676 m)    Wt 79.4 kg    SpO2 100%    BMI 28.25 kg/m   Physical Exam Vitals signs and nursing note reviewed.  Constitutional:      General: She is not in acute distress.    Appearance: Normal appearance. She is well-developed and normal weight. She is not ill-appearing or diaphoretic.     Comments: Actively vomiting on my evaluation  HENT:     Head: Normocephalic and atraumatic.     Nose: Nose normal.     Mouth/Throat:     Mouth: Mucous  membranes are moist.     Pharynx: Oropharynx is clear.  Eyes:     General:        Right eye: No discharge.        Left eye: No discharge.     Pupils: Pupils are equal, round, and reactive to light.  Neck:     Musculoskeletal: Neck supple.  Cardiovascular:     Rate and Rhythm: Normal rate and regular rhythm.     Pulses: Normal pulses.     Heart sounds: Normal heart sounds. No murmur. No friction rub. No gallop.   Pulmonary:     Effort: Pulmonary effort is normal. No respiratory distress.     Breath sounds: Normal breath sounds. No wheezing or rales.     Comments: Respirations equal and unlabored, patient able to speak in full sentences, lungs clear to auscultation bilaterally Abdominal:     General: Abdomen is flat. Bowel sounds are normal. There is no distension.     Palpations: Abdomen is soft. There is no mass.     Tenderness: There is no abdominal tenderness. There is no guarding.     Comments: Abdomen soft, nondistended, nontender to palpation in all quadrants without guarding or peritoneal signs  Musculoskeletal:        General: No deformity.  Skin:    General: Skin is warm and dry.     Capillary Refill: Capillary refill takes less than 2 seconds.  Neurological:     Mental Status: She is alert.     Coordination: Coordination normal.     Comments: Speech is clear, able to follow commands Moves extremities without ataxia, coordination intact  Psychiatric:        Mood and Affect: Mood normal.        Behavior: Behavior normal.      ED Treatments / Results  Labs (all labs ordered are listed, but only abnormal results are displayed) Labs Reviewed  COMPREHENSIVE METABOLIC PANEL - Abnormal; Notable for the following components:      Result Value   Potassium 3.4 (*)    Glucose, Bld 109 (*)    All other components within normal limits  CBC WITH DIFFERENTIAL/PLATELET - Abnormal; Notable for the following components:   WBC 11.7 (*)    All other components within normal  limits  LIPASE, BLOOD  URINALYSIS, ROUTINE W REFLEX MICROSCOPIC  PREGNANCY, URINE    EKG EKG Interpretation  Date/Time:  Monday Jul 31 2018 12:37:57 EDT Ventricular Rate:  83 PR Interval:    QRS Duration: 98 QT Interval:  350 QTC Calculation: 412 R Axis:   92 Text Interpretation:  Sinus rhythm Short PR interval Borderline right axis deviation Baseline wander in lead(s) V3 No previous ECGs available Confirmed by Vanetta MuldersZackowski, Scott [phone]) on 07/31/2018 12:41:26 PM Also confirmed by Vanetta MuldersZackowski, Scott [phone]), editor Barbette Hairassel, Kerry [phone])  on 08/01/2018 7:25:07 AM   Radiology No results found.  Procedures Procedures (including critical care time)  Medications Ordered in ED Medications  ondansetron (ZOFRAN) injection 4 mg (4 mg Intravenous Given 07/31/18 1126)  sodium chloride 0.9 % bolus 1,000 mL (0 mLs Intravenous Stopped 07/31/18 1300)  famotidine (PEPCID) IVPB 20 mg premix (0 mg Intravenous Stopped 07/31/18 1151)  metoCLOPramide (REGLAN) injection 10 mg (10 mg Intravenous Given 07/31/18 1148)  diphenhydrAMINE (BENADRYL) injection 25 mg (25 mg Intravenous Given 07/31/18 1146)  haloperidol lactate (HALDOL) injection 2.5 mg (2.5 mg Intravenous Given 07/31/18 1305)  capsicum (ZOSTRIX) 0.075 % cream ( Topical Given 07/31/18 1531)  sodium chloride 0.9 % bolus 1,000 mL (0 mLs Intravenous Stopped 07/31/18 1654)  ondansetron (ZOFRAN) injection 4 mg (4 mg Intravenous Given 07/31/18 1531)     Initial Impression / Assessment and Plan / ED Course  I have reviewed the triage vital signs and the nursing notes.  Pertinent labs & imaging results that were available during my care of the patient were reviewed by me and considered in my medical decision making (see chart for details).  Pt presents for persistent nausea and vomiting that began at 6 AM this morning. Some associated abdominal cramping occurs just prio to emesis but no persistent abdominal pain, no fevers, CP, SOB. No associated urinary  symptoms. Pt doe snot think she is pregnant, LMP 2 weeks ago. PT is not ill appearing, slight tachycardia but vitals otherwise normal. Abdominal exam is benign. Chart review reveals history of many similar ED visits and history of cyclic vomiting syndrome thought to be related to cannabis use, which patient initially denied, but then reported intermittent, use, last used over the weekend. Will give, zofran, pepcid and fluids and check abdominal labs, given hx of same and reassuring exam, no imagine indicated. Will check EKG to assess QT and pt may require multiple anti-emetics.  Labs reassuring, slight leukocytosis of 11.7, normal hemoglobin, potassium of 3.4 but no other acute electrolyte derangements requiring intervention, normal renal and liver function, normal lipase.  Negative pregnancy and urinalysis without any signs of infection.  Patient received Zofran but continued to have emesis and retching, Reglan and Benadryl given initially with some improvement but then patient began retching again.  EKG shows normal QT.  Will give Haldol for cyclic vomiting.  After haldol pt initially had improvement and was able to rest without furhter emesis, after PO trial with ginger ale pt began retching again, will try capsicin cream, if emesis continues, may need admission for intractable vomiting, throughout stay vitals have remained stable and abdomen remains benign.  5:00 PM Notified by nursing staff that the patient pulled out her IV and walked out the ambulance bay doors, patient refused to come back into the facility, she did take her capsaicin cream with her which seems to help with her persistent vomiting.  Patient eloped from department and is ambulatory in the parking lot in no acute distress.  Final Clinical Impressions(s) / ED Diagnoses   Final diagnoses:  Cyclical vomiting    ED Discharge Orders    None       Legrand RamsFord, Gedalya Jim N, PA-C 08/02/18 2019    Vanetta MuldersZackowski, Scott, MD 08/03/18 2126

## 2018-07-31 NOTE — ED Notes (Signed)
Pt amb to restroom with quick, steady gait in nad. No dry heaving noted.

## 2018-08-03 ENCOUNTER — Emergency Department (HOSPITAL_BASED_OUTPATIENT_CLINIC_OR_DEPARTMENT_OTHER)
Admission: EM | Admit: 2018-08-03 | Discharge: 2018-08-03 | Disposition: A | Discharge status: Home / Self Care | Payer: Self-pay | Attending: Emergency Medicine | Admitting: Emergency Medicine

## 2018-08-03 ENCOUNTER — Other Ambulatory Visit: Payer: Self-pay

## 2018-08-03 ENCOUNTER — Encounter (HOSPITAL_BASED_OUTPATIENT_CLINIC_OR_DEPARTMENT_OTHER): Payer: Self-pay | Admitting: *Deleted

## 2018-08-03 DIAGNOSIS — R110 Nausea: Secondary | ICD-10-CM | POA: Insufficient documentation

## 2018-08-03 DIAGNOSIS — F17210 Nicotine dependence, cigarettes, uncomplicated: Secondary | ICD-10-CM | POA: Insufficient documentation

## 2018-08-03 DIAGNOSIS — J45909 Unspecified asthma, uncomplicated: Secondary | ICD-10-CM | POA: Insufficient documentation

## 2018-08-03 DIAGNOSIS — Z79899 Other long term (current) drug therapy: Secondary | ICD-10-CM | POA: Insufficient documentation

## 2018-08-03 MED ORDER — HALOPERIDOL LACTATE 5 MG/ML IJ SOLN
5.0000 mg | Freq: Once | INTRAMUSCULAR | Status: AC
  Administered 2018-08-03: 5 mg via INTRAMUSCULAR
  Filled 2018-08-03: qty 1

## 2018-08-03 MED ORDER — ONDANSETRON HCL 8 MG PO TABS
4.0000 mg | ORAL_TABLET | Freq: Once | ORAL | Status: AC
  Administered 2018-08-03: 4 mg via ORAL
  Filled 2018-08-03: qty 1

## 2018-08-03 NOTE — ED Provider Notes (Signed)
[facility] EMERGENCY DEPARTMENT Provider Note   CSN: [id number] Arrival date & time: 08/03/18  1104    History   Chief Complaint Chief Complaint  Patient presents with   Nausea    HPI Sherry Powell is a 23 y.o. female.     The history is provided by the patient and medical records. No language interpreter was used.   Sherry Powell is a 23 y.o. female  with a PMH of cyclical nausea and vomiting who presents to the Emergency Department complaining of nausea which began this morning about 6 AM.  Feels similar to her symptoms that she has had in the past with her cyclical vomiting syndrome.  Associated with one episode of emesis which she describes as " kind of threw up, but was just spitting stuff".  Has not had any more vomiting since this episode this morning.  She tried to take 1 of her home Phenergan, but states that she threw up right afterwards and it did not help at all.  She was seen in the emergency department for same on 5/25.  She states that at discharge, she was feeling better, but symptoms returned this morning.  She does have capsaicin cream at home, but states that it burns too bad and she does not want to use it.  She states that since leaving the hospital, she has not smoked any more marijuana, therefore her last time to smoke was 3 days ago.   Past Medical History:  Diagnosis Date   Asthma     There are no active problems to display for this patient.   Past Surgical History:  Procedure Laterality Date   FOOT SURGERY       OB History    Gravida  1   Para      Term      Preterm      AB      Living        SAB      TAB      Ectopic      Multiple      Live Births               Home Medications    Prior to Admission medications   Medication Sig Start Date End Date Taking? Authorizing Provider  cephALEXin (KEFLEX) 500 MG capsule Take 1 capsule (500 mg total) by mouth 2 (two) times daily. Patient not taking:  Reported on 01/28/2016 01/25/15   Tilden Fossaees, Elizabeth, MD  cyclobenzaprine (FLEXERIL) 10 MG tablet Take 1 tablet (10 mg total) by mouth 2 (two) times daily as needed for muscle spasms. 01/29/16   Alvira MondaySchlossman, Erin, MD  ibuprofen (ADVIL,MOTRIN) 400 MG tablet Take 1 tablet (400 mg total) by mouth every 6 (six) hours as needed. 01/29/16   Alvira MondaySchlossman, Erin, MD  naproxen (NAPROSYN) 500 MG tablet Take 1 tablet (500 mg total) by mouth 2 (two) times daily. 03/27/17   Khatri, Hina, PA-C  ondansetron (ZOFRAN ODT) 4 MG disintegrating tablet Take 1 tablet (4 mg total) by mouth every 8 (eight) hours as needed for nausea or vomiting. 03/27/17   Dietrich PatesKhatri, Hina, PA-C    Family History No family history on file.  Social History Social History   Tobacco Use   Smoking status: Current Every Day Smoker    Types: Cigars   Smokeless tobacco: Never Used  Substance Use Topics   Alcohol use: Yes   Drug use: Yes    Types: Marijuana     Allergies  Strawberry (diagnostic)   Review of Systems Review of Systems  Gastrointestinal: Positive for nausea and vomiting. Negative for abdominal pain, constipation and diarrhea.  All other systems reviewed and are negative.    Physical Exam Updated Vital Signs BP (!) 131/93    Pulse 92    Temp 97.7 F (36.5 C) (Oral)    Resp 18    Ht 5\' 6"  (1.676 m)    Wt 79.3 kg    LMP 07/20/2018    SpO2 100%    BMI 28.22 kg/m   Physical Exam Vitals signs and nursing note reviewed.  Constitutional:      General: She is not in acute distress.    Appearance: She is well-developed.     Comments: Nontoxic-appearing.  HENT:     Head: Normocephalic and atraumatic.  Neck:     Musculoskeletal: Neck supple.  Cardiovascular:     Rate and Rhythm: Normal rate and regular rhythm.     Heart sounds: Normal heart sounds. No murmur.  Pulmonary:     Effort: Pulmonary effort is normal. No respiratory distress.     Breath sounds: Normal breath sounds.  Abdominal:     General: There is no  distension.     Palpations: Abdomen is soft.     Comments: No abdominal tenderness.  No flank or CVA tenderness.  Skin:    General: Skin is warm and dry.  Neurological:     Mental Status: She is alert and oriented to person, place, and time.      ED Treatments / Results  Labs (all labs ordered are listed, but only abnormal results are displayed) Labs Reviewed - No data to display  EKG None  Radiology No results found.  Procedures Procedures (including critical care time)  Medications Ordered in ED Medications  haloperidol lactate (HALDOL) injection 5 mg (5 mg Intramuscular Given 08/03/18 1138)  ondansetron (ZOFRAN) tablet 4 mg (4 mg Oral Given 08/03/18 1158)     Initial Impression / Assessment and Plan / ED Course  I have reviewed the triage vital signs and the nursing notes.  Pertinent labs & imaging results that were available during my care of the patient were reviewed by me and considered in my medical decision making (see chart for details).       Sherry Powell is a 23 y.o. female who presents to ED for return of her nausea this morning around 6 AM. She was seen in the emergency department for same on 5/25.  I have thoroughly reviewed her chart from this encounter.  Pregnancy test was negative 3 days ago.  CMP reassuring. CBC with leukocytosis of 11.7. On exam today, she is afebrile, hemodynamically stable with a benign abdominal exam.  She is having nausea, but does not appear to have any true episodes of emesis. She is not having diarrhea. She has hx of same. This does not appear infectious to me today.  Overall, she does appear quite well.  Do not feel that repeating labs and urine again today would add much benefit.  We will try to get her symptoms a little more controlled while in the ED today.  Of note, I was notified by nursing staff that when patient was here 3 days ago, she did elope with her IV in place and staff is concerned about placing another IV. This  is a valid concern. Given that she is not having any active vomiting, has no abdominal tenderness, is afebrile and hemodynamically stable, feel we can treat  with IM and p.o. medications and continue to monitor her closely.  12:04 PM - Patient reevaluated.  She continues to complain of nausea after Haldol, has not received her p.o. Zofran yet.  She is walking around the room. Sat on bed for re-eval. None of this seems to cause her any discomfort. Still has benign abdominal exam. She has not had any episodes of vomiting while here in the ER.  She is very adamant that she needs Zofran through her IV and not by mouth.  She is also saying that she needs IV fluids so I need to put an IV in her.  She appears well-hydrated on exam with moist mucous membranes and good cap refill.  No clinical evidence of dehydration and she is not having any vomiting or diarrhea.  We will continue with plan for p.o. medications and she was given water for oral rehydration.  Continue to monitor her closely and watch for vomiting or other symptoms.   12:31 PM - Notified by nursing staff that patient eloped.   Final Clinical Impressions(s) / ED Diagnoses   Final diagnoses:  Nausea    ED Discharge Orders    None       Kamee Bobst, Chase Picket, PA-C 08/03/18 1231    Arby Barrette, MD 08/18/18 1441

## 2018-08-03 NOTE — ED Notes (Signed)
ED Provider at bedside. 

## 2018-08-03 NOTE — ED Triage Notes (Signed)
Nausea. She was seen for same 2 days ago for same. Hx of nausea after smoking mariajuana.

## 2018-08-03 NOTE — ED Notes (Signed)
Pt seen walking out front doors by triage nurse Jasmine December.

## 2018-08-03 NOTE — ED Notes (Signed)
Pt demanding to see edp, this rn explaining plan that in a few minutes, after haldol has time to work, I will give her po zofran per her request, pt states odt does not work for her. Pt crying, screaming, and cursing at staff. Asher Muir, PA alerted to bedside, explains plan of care to pt.

## 2018-08-03 NOTE — ED Notes (Signed)
Pt called in parking lot, no response.

## 2018-11-19 ENCOUNTER — Encounter (HOSPITAL_BASED_OUTPATIENT_CLINIC_OR_DEPARTMENT_OTHER): Payer: Self-pay | Admitting: Emergency Medicine

## 2018-11-19 ENCOUNTER — Other Ambulatory Visit: Payer: Self-pay

## 2018-11-19 ENCOUNTER — Emergency Department (HOSPITAL_BASED_OUTPATIENT_CLINIC_OR_DEPARTMENT_OTHER)
Admission: EM | Admit: 2018-11-19 | Discharge: 2018-11-19 | Discharge status: Against Medical Advice | Payer: Self-pay | Attending: Emergency Medicine | Admitting: Emergency Medicine

## 2018-11-19 DIAGNOSIS — Z5321 Procedure and treatment not carried out due to patient leaving prior to being seen by health care provider: Secondary | ICD-10-CM | POA: Insufficient documentation

## 2018-11-19 LAB — COMPREHENSIVE METABOLIC PANEL
ALT: 13 U/L (ref 0–44)
AST: 23 U/L (ref 15–41)
Albumin: 4.3 g/dL (ref 3.5–5.0)
Alkaline Phosphatase: 53 U/L (ref 38–126)
Anion gap: 11 (ref 5–15)
BUN: 14 mg/dL (ref 6–20)
CO2: 21 mmol/L — ABNORMAL LOW (ref 22–32)
Calcium: 9.5 mg/dL (ref 8.9–10.3)
Chloride: 104 mmol/L (ref 98–111)
Creatinine, Ser: 0.73 mg/dL (ref 0.44–1.00)
GFR calc Af Amer: >60 mL/min (ref >60)
GFR calc non Af Amer: >60 mL/min (ref >60)
Glucose, Bld: 89 mg/dL (ref 70–99)
Potassium: 3.6 mmol/L (ref 3.5–5.1)
Sodium: 136 mmol/L (ref 135–145)
Total Bilirubin: 0.5 mg/dL (ref 0.3–1.2)
Total Protein: 7.9 g/dL (ref 6.5–8.1)

## 2018-11-19 LAB — CBC
HCT: 37.5 % (ref 36.0–46.0)
Hemoglobin: 11.7 g/dL — ABNORMAL LOW (ref 12.0–15.0)
MCH: 26.9 pg (ref 26.0–34.0)
MCHC: 31.2 g/dL (ref 30.0–36.0)
MCV: 86.2 fL (ref 80.0–100.0)
Platelets: 340 10*3/uL (ref 150–400)
RBC: 4.35 MIL/uL (ref 3.87–5.11)
RDW: 14.6 % (ref 11.5–15.5)
WBC: 11 10*3/uL — ABNORMAL HIGH (ref 4.0–10.5)
nRBC: 0 % (ref 0.0–0.2)

## 2018-11-19 LAB — LIPASE, BLOOD: Lipase: 35 U/L (ref 11–51)

## 2018-11-19 MED ORDER — ONDANSETRON HCL 4 MG/2ML IJ SOLN
4.0000 mg | Freq: Once | INTRAMUSCULAR | Status: DC
  Filled 2018-11-19: qty 2

## 2018-11-19 MED ORDER — FENTANYL CITRATE (PF) 100 MCG/2ML IJ SOLN
50.0000 ug | INTRAMUSCULAR | Status: DC | PRN
  Filled 2018-11-19: qty 2

## 2018-11-19 NOTE — ED Triage Notes (Signed)
Pt here with trich infection and is in 10/10 pain. Guarding and tearful. States no pain on urination.

## 2018-11-19 NOTE — ED Notes (Signed)
Pt aware we need urine specimen, unable to provide sample at this time 

## 2018-11-19 NOTE — ED Notes (Signed)
Pt requested to have IV removed, she did not want to wait for provider. Pt requested treatment to "cure" her symptoms and was informed that she needed to be seen by provider before any treatment could be provided. Pt left before seeing provider, after being triaged and receiving an IV.

## 2019-01-27 ENCOUNTER — Other Ambulatory Visit: Payer: Self-pay

## 2019-01-27 ENCOUNTER — Emergency Department (HOSPITAL_BASED_OUTPATIENT_CLINIC_OR_DEPARTMENT_OTHER)
Admission: EM | Admit: 2019-01-27 | Discharge: 2019-01-27 | Disposition: A | Discharge status: Home / Self Care | Payer: Self-pay | Attending: Emergency Medicine | Admitting: Emergency Medicine

## 2019-01-27 ENCOUNTER — Encounter (HOSPITAL_BASED_OUTPATIENT_CLINIC_OR_DEPARTMENT_OTHER): Payer: Self-pay | Admitting: Emergency Medicine

## 2019-01-27 DIAGNOSIS — R1032 Left lower quadrant pain: Secondary | ICD-10-CM

## 2019-01-27 DIAGNOSIS — F17210 Nicotine dependence, cigarettes, uncomplicated: Secondary | ICD-10-CM | POA: Insufficient documentation

## 2019-01-27 DIAGNOSIS — J45909 Unspecified asthma, uncomplicated: Secondary | ICD-10-CM | POA: Insufficient documentation

## 2019-01-27 DIAGNOSIS — N946 Dysmenorrhea, unspecified: Secondary | ICD-10-CM | POA: Insufficient documentation

## 2019-01-27 DIAGNOSIS — Z91018 Allergy to other foods: Secondary | ICD-10-CM | POA: Insufficient documentation

## 2019-01-27 LAB — URINALYSIS, ROUTINE W REFLEX MICROSCOPIC
Bilirubin Urine: NEGATIVE
Glucose, UA: NEGATIVE mg/dL
Ketones, ur: NEGATIVE mg/dL
Leukocytes,Ua: NEGATIVE
Nitrite: NEGATIVE
Protein, ur: NEGATIVE mg/dL
Specific Gravity, Urine: 1.02 (ref 1.005–1.030)
pH: 7 (ref 5.0–8.0)

## 2019-01-27 LAB — URINALYSIS, MICROSCOPIC (REFLEX)

## 2019-01-27 LAB — PREGNANCY, URINE: Preg Test, Ur: NEGATIVE

## 2019-01-27 MED ORDER — ACETAMINOPHEN 325 MG PO TABS
650.0000 mg | ORAL_TABLET | Freq: Once | ORAL | Status: AC
  Administered 2019-01-27: 650 mg via ORAL
  Filled 2019-01-27: qty 2

## 2019-01-27 MED ORDER — DICYCLOMINE HCL 20 MG PO TABS: 20.0000 mg | ORAL_TABLET | Freq: Two times a day (BID) | ORAL | 0 refills | Status: DC

## 2019-01-27 MED ORDER — DICYCLOMINE HCL 10 MG PO CAPS
20.0000 mg | ORAL_CAPSULE | Freq: Once | ORAL | Status: AC
  Administered 2019-01-27: 11:00:00 20 mg via ORAL
  Filled 2019-01-27: qty 2

## 2019-01-27 MED ORDER — ONDANSETRON HCL 4 MG PO TABS: 4.0000 mg | ORAL_TABLET | Freq: Four times a day (QID) | ORAL | 0 refills | Status: DC

## 2019-01-27 NOTE — ED Provider Notes (Signed)
[facility] EMERGENCY DEPARTMENT Provider Note   CSN: [id number] Arrival date & time: 01/27/19  [id number]     History   Chief Complaint Chief Complaint  Patient presents with  . Abdominal Pain    HPI [name] is a 23 y.o. female.     HPI  23 year old female who presents for left lower quadrant abdominal pain that began yesterday, states that she is currently on her period and that she is experiencing what feels like her normal character of crampy, sharp, intermittent stabbing left lower quadrant abdominal pain however states that this is worse than usual. Patient states pain began yesterday with her period starting two days ago. Patient states this is the usual timeline of her periods/period pain.  States that she used ibuprofen and Alka-Seltzer this morning with improvement of pain from a 10/10-6/10.  Denies any nausea, vomiting, fever, migratory pain, anorexia, worsening pain with movement.  Does endorses normal bowel movement this morning that was neither loose nor constipated.  No blood in stools.  No dark tarry stools. Has passed flatus today as well.   Patient denies any alcohol or drug use.  Denies any history of pancreatitis.  Only abdominal surgery was C-section in 2017.  Denies any history of SBO.  States most recent abdominal CT was less than 1 year ago.    Patient states she is not concerned for any sexually transmitted diseases.  Denies any vaginal discharge, itching, vaginal pain, pelvic pain, pain with urination, hematuria or frequency urgency.     Past Medical History:  Diagnosis Date  . Asthma     There are no active problems to display for this patient.   Past Surgical History:  Procedure Laterality Date  . FOOT SURGERY       OB History    Gravida  1   Para      Term      Preterm      AB      Living        SAB      TAB      Ectopic      Multiple      Live Births               Home Medications    Prior to  Admission medications   Medication Sig Start Date End Date Taking? Authorizing Provider  cephALEXin (KEFLEX) 500 MG capsule Take 1 capsule (500 mg total) by mouth 2 (two) times daily. Patient not taking: Reported on 01/28/2016 01/25/15   Tilden Fossaees, Elizabeth, MD  cyclobenzaprine (FLEXERIL) 10 MG tablet Take 1 tablet (10 mg total) by mouth 2 (two) times daily as needed for muscle spasms. 01/29/16   Alvira MondaySchlossman, Erin, MD  dicyclomine (BENTYL) 20 MG tablet Take 1 tablet (20 mg total) by mouth 2 (two) times daily. 01/27/19   Gailen ShelterFondaw, Jaylee Freeze S, PA  ibuprofen (ADVIL,MOTRIN) 400 MG tablet Take 1 tablet (400 mg total) by mouth every 6 (six) hours as needed. 01/29/16   Alvira MondaySchlossman, Erin, MD  naproxen (NAPROSYN) 500 MG tablet Take 1 tablet (500 mg total) by mouth 2 (two) times daily. 03/27/17   Khatri, Hina, PA-C  ondansetron (ZOFRAN ODT) 4 MG disintegrating tablet Take 1 tablet (4 mg total) by mouth every 8 (eight) hours as needed for nausea or vomiting. 03/27/17   Khatri, Hina, PA-C  ondansetron (ZOFRAN) 4 MG tablet Take 1 tablet (4 mg total) by mouth every 6 (six) hours. 01/27/19   Gailen ShelterFondaw, Mayari Matus S, PA  Family History History reviewed. No pertinent family history.  Social History Social History   Tobacco Use  . Smoking status: Current Every Day Smoker    Types: Cigars  . Smokeless tobacco: Never Used  Substance Use Topics  . Alcohol use: Yes  . Drug use: Yes    Types: Marijuana     Allergies   Strawberry (diagnostic)   Review of Systems Review of Systems  Constitutional: Negative for fever.  Gastrointestinal: Positive for abdominal pain. Negative for constipation, diarrhea, nausea and vomiting.  Genitourinary: Positive for vaginal bleeding. Negative for hematuria, pelvic pain, urgency, vaginal discharge and vaginal pain.  All other systems reviewed and are negative.    Physical Exam Updated Vital Signs BP 116/76   Pulse 80   Temp 98.2 F (36.8 C) (Oral)   Resp 18   Ht 5\' 6"  (1.676  m)   Wt 81.6 kg   LMP 01/24/2019 (Exact Date)   SpO2 100%   BMI 29.05 kg/m   Physical Exam Vitals signs and nursing note reviewed.  Constitutional:      General: She is not in acute distress.    Appearance: Normal appearance. She is not ill-appearing.  HENT:     Head: Normocephalic and atraumatic.     Nose: Nose normal.  Eyes:     General: No scleral icterus.       Right eye: No discharge.        Left eye: No discharge.     Conjunctiva/sclera: Conjunctivae normal.  Neck:     Musculoskeletal: Normal range of motion.  Cardiovascular:     Rate and Rhythm: Normal rate and regular rhythm.     Pulses: Normal pulses.     Heart sounds: Normal heart sounds.  Pulmonary:     Effort: Pulmonary effort is normal. No respiratory distress.     Breath sounds: No stridor. No wheezing.  Abdominal:     General: Bowel sounds are normal.     Palpations: Abdomen is soft.     Tenderness: There is abdominal tenderness (Mild, left lower quadrant). There is no guarding.     Hernia: No hernia is present.  Musculoskeletal:     Right lower leg: No edema.     Left lower leg: No edema.  Skin:    General: Skin is warm and dry.     Capillary Refill: Capillary refill takes less than 2 seconds.  Neurological:     Mental Status: She is alert and oriented to person, place, and time. Mental status is at baseline.  Psychiatric:        Mood and Affect: Mood normal.        Behavior: Behavior normal.      ED Treatments / Results  Labs (all labs ordered are listed, but only abnormal results are displayed) Labs Reviewed  URINALYSIS, ROUTINE W REFLEX MICROSCOPIC - Abnormal; Notable for the following components:      Result Value   Hgb urine dipstick LARGE (*)    All other components within normal limits  URINALYSIS, MICROSCOPIC (REFLEX) - Abnormal; Notable for the following components:   Bacteria, UA FEW (*)    All other components within normal limits  PREGNANCY, URINE    EKG None  Radiology No  results found.  Procedures Procedures (including critical care time)  Medications Ordered in ED Medications  dicyclomine (BENTYL) capsule 20 mg (20 mg Oral Given 01/27/19 1037)  acetaminophen (TYLENOL) tablet 650 mg (650 mg Oral Given 01/27/19 1037)  Initial Impression / Assessment and Plan / ED Course  I have reviewed the triage vital signs and the nursing notes.  Pertinent labs & imaging results that were available during my care of the patient were reviewed by me and considered in my medical decision making (see chart for details).        Patient is 23 year old female presented today with abdominal pain that is similar in quality and in character and location to her normal menstrual period cramping. Patient states that she is currently on her period.  States that her pain is much improved with ibuprofen and Alka-Seltzer.   Patient is well appearing, playing on phone during initial physical exam. Patient initially with mild LLQ tenderness without guarding or rebound; on re-examination 1 hour later patient states improvement with bentyl and tylenol and found to be asleep--her repeat exam was without tenderness. States she feels well to return home.   UA significant for large Hgb explained by currently being on her menstrual cycle. No indication for further testing. Pregnancy test negative. Doubt ectopic, doubt torsion, doubt PID/TOA. Most likely sx explained by menstrual cramps, pt may have ovarian cyst causing her increased sx. Reassuring that tylenol and ibuprofen brought pain from a 10/10 to 0/10. Vitals are WNL.  Shared decision making discussion with patient who states that she would prefer to defer blood work and pelvic exam.  States that she will return if she has new or concerning symptoms. I believe this is reasonable at this time as patient has no pelvic or vaginal pain and her abdominal pain is resolved. Patient given return precautions.   Patient given prescription for  bentyl and zofran.   Final Clinical Impressions(s) / ED Diagnoses   Final diagnoses:  Menstrual cramp  Left lower quadrant abdominal pain    ED Discharge Orders         Ordered    dicyclomine (BENTYL) 20 MG tablet  2 times daily     01/27/19 1206    ondansetron (ZOFRAN) 4 MG tablet  Every 6 hours     01/27/19 [place], Trysta Showman [place], [place] 01/28/19 3614    Fredia Sorrow, MD 01/28/19 [phone]

## 2019-01-27 NOTE — ED Triage Notes (Signed)
Pt here with LLQ abd pain since yesterday. Pt on her period. States it does not feel like her period cramps.

## 2019-01-27 NOTE — ED Notes (Signed)
ED Provider at bedside. 

## 2019-01-27 NOTE — ED Notes (Signed)
Given cranberry cocktail juice, no n/v

## 2019-01-27 NOTE — ED Notes (Signed)
Pt reports improvement in pain and nausea.

## 2019-01-27 NOTE — Discharge Instructions (Addendum)
Please return for any new or concerning symptoms including those we discussed. Follow up with the gastroenterologist whose information is above if symptoms persist. I have also included information for a primary care doctor. Please get established with them. Take ibuprofen 600 mg every 6-8 hours as needed for pain.

## 2019-06-24 ENCOUNTER — Observation Stay (HOSPITAL_COMMUNITY): Payer: Medicaid Other

## 2019-06-24 ENCOUNTER — Emergency Department (HOSPITAL_COMMUNITY): Payer: Medicaid Other

## 2019-06-24 ENCOUNTER — Encounter (HOSPITAL_COMMUNITY): Payer: Self-pay | Admitting: Emergency Medicine

## 2019-06-24 ENCOUNTER — Inpatient Hospital Stay (HOSPITAL_COMMUNITY)
Admission: EM | Admit: 2019-06-24 | Discharge: 2019-06-27 | DRG: 551 | Disposition: A | Discharge status: Home / Self Care | Payer: Medicaid Other | Attending: Physician Assistant | Admitting: Physician Assistant

## 2019-06-24 ENCOUNTER — Other Ambulatory Visit: Payer: Self-pay

## 2019-06-24 DIAGNOSIS — S22000A Wedge compression fracture of unspecified thoracic vertebra, initial encounter for closed fracture: Secondary | ICD-10-CM

## 2019-06-24 DIAGNOSIS — S22009A Unspecified fracture of unspecified thoracic vertebra, initial encounter for closed fracture: Secondary | ICD-10-CM

## 2019-06-24 DIAGNOSIS — T07XXXA Unspecified multiple injuries, initial encounter: Secondary | ICD-10-CM

## 2019-06-24 DIAGNOSIS — W3400XA Accidental discharge from unspecified firearms or gun, initial encounter: Secondary | ICD-10-CM

## 2019-06-24 DIAGNOSIS — S32008A Other fracture of unspecified lumbar vertebra, initial encounter for closed fracture: Secondary | ICD-10-CM

## 2019-06-24 DIAGNOSIS — D649 Anemia, unspecified: Secondary | ICD-10-CM | POA: Diagnosis not present

## 2019-06-24 DIAGNOSIS — S22060A Wedge compression fracture of T7-T8 vertebra, initial encounter for closed fracture: Principal | ICD-10-CM | POA: Diagnosis present

## 2019-06-24 DIAGNOSIS — E876 Hypokalemia: Secondary | ICD-10-CM | POA: Diagnosis not present

## 2019-06-24 DIAGNOSIS — Y249XXA Unspecified firearm discharge, undetermined intent, initial encounter: Secondary | ICD-10-CM

## 2019-06-24 DIAGNOSIS — S31040A Puncture wound with foreign body of lower back and pelvis without penetration into retroperitoneum, initial encounter: Secondary | ICD-10-CM | POA: Diagnosis present

## 2019-06-24 DIAGNOSIS — S41131A Puncture wound without foreign body of right upper arm, initial encounter: Secondary | ICD-10-CM | POA: Diagnosis present

## 2019-06-24 DIAGNOSIS — Y904 Blood alcohol level of 80-99 mg/100 ml: Secondary | ICD-10-CM | POA: Diagnosis present

## 2019-06-24 DIAGNOSIS — S2220XA Unspecified fracture of sternum, initial encounter for closed fracture: Secondary | ICD-10-CM | POA: Diagnosis present

## 2019-06-24 DIAGNOSIS — F10129 Alcohol abuse with intoxication, unspecified: Secondary | ICD-10-CM | POA: Diagnosis present

## 2019-06-24 DIAGNOSIS — F17210 Nicotine dependence, cigarettes, uncomplicated: Secondary | ICD-10-CM | POA: Diagnosis present

## 2019-06-24 DIAGNOSIS — Y92410 Unspecified street and highway as the place of occurrence of the external cause: Secondary | ICD-10-CM

## 2019-06-24 DIAGNOSIS — K5900 Constipation, unspecified: Secondary | ICD-10-CM | POA: Diagnosis not present

## 2019-06-24 DIAGNOSIS — S41041A Puncture wound with foreign body of right shoulder, initial encounter: Secondary | ICD-10-CM | POA: Diagnosis present

## 2019-06-24 DIAGNOSIS — S42151B Displaced fracture of neck of scapula, right shoulder, initial encounter for open fracture: Secondary | ICD-10-CM | POA: Diagnosis present

## 2019-06-24 DIAGNOSIS — Z20822 Contact with and (suspected) exposure to covid-19: Secondary | ICD-10-CM | POA: Diagnosis present

## 2019-06-24 DIAGNOSIS — S22070A Wedge compression fracture of T9-T10 vertebra, initial encounter for closed fracture: Secondary | ICD-10-CM | POA: Diagnosis present

## 2019-06-24 DIAGNOSIS — S32048B Other fracture of fourth lumbar vertebra, initial encounter for open fracture: Secondary | ICD-10-CM | POA: Diagnosis present

## 2019-06-24 DIAGNOSIS — F12929 Cannabis use, unspecified with intoxication, unspecified: Secondary | ICD-10-CM | POA: Diagnosis present

## 2019-06-24 LAB — LACTIC ACID, PLASMA: Lactic Acid, Venous: 4 mmol/L (ref 0.5–1.9)

## 2019-06-24 LAB — I-STAT CHEM 8, ED
BUN: 12 mg/dL (ref 6–20)
Calcium, Ion: 1.14 mmol/L — ABNORMAL LOW (ref 1.15–1.40)
Chloride: 108 mmol/L (ref 98–111)
Creatinine, Ser: 1 mg/dL (ref 0.44–1.00)
Glucose, Bld: 131 mg/dL — ABNORMAL HIGH (ref 70–99)
HCT: 37 % (ref 36.0–46.0)
Hemoglobin: 12.6 g/dL (ref 12.0–15.0)
Potassium: 3.5 mmol/L (ref 3.5–5.1)
Sodium: 140 mmol/L (ref 135–145)
TCO2: 20 mmol/L — ABNORMAL LOW (ref 22–32)

## 2019-06-24 LAB — SAMPLE TO BLOOD BANK

## 2019-06-24 LAB — RESPIRATORY PANEL BY RT PCR (FLU A&B, COVID)
Influenza A by PCR: NEGATIVE
Influenza B by PCR: NEGATIVE
SARS Coronavirus 2 by RT PCR: NEGATIVE

## 2019-06-24 LAB — I-STAT BETA HCG BLOOD, ED (MC, WL, AP ONLY): I-stat hCG, quantitative: <5 m[IU]/mL (ref <5)

## 2019-06-24 LAB — BASIC METABOLIC PANEL
Anion gap: 12 (ref 5–15)
BUN: 7 mg/dL (ref 6–20)
CO2: 15 mmol/L — ABNORMAL LOW (ref 22–32)
Calcium: 7.8 mg/dL — ABNORMAL LOW (ref 8.9–10.3)
Chloride: 111 mmol/L (ref 98–111)
Creatinine, Ser: 0.72 mg/dL (ref 0.44–1.00)
GFR calc Af Amer: >60 mL/min (ref >60)
GFR calc non Af Amer: >60 mL/min (ref >60)
Glucose, Bld: 109 mg/dL — ABNORMAL HIGH (ref 70–99)
Potassium: 3.1 mmol/L — ABNORMAL LOW (ref 3.5–5.1)
Sodium: 138 mmol/L (ref 135–145)

## 2019-06-24 LAB — COMPREHENSIVE METABOLIC PANEL
ALT: 21 U/L (ref 0–44)
AST: 45 U/L — ABNORMAL HIGH (ref 15–41)
Albumin: 3.9 g/dL (ref 3.5–5.0)
Alkaline Phosphatase: 52 U/L (ref 38–126)
Anion gap: 14 (ref 5–15)
BUN: 9 mg/dL (ref 6–20)
CO2: 15 mmol/L — ABNORMAL LOW (ref 22–32)
Calcium: 8.8 mg/dL — ABNORMAL LOW (ref 8.9–10.3)
Chloride: 107 mmol/L (ref 98–111)
Creatinine, Ser: 0.97 mg/dL (ref 0.44–1.00)
GFR calc Af Amer: >60 mL/min (ref >60)
GFR calc non Af Amer: >60 mL/min (ref >60)
Glucose, Bld: 135 mg/dL — ABNORMAL HIGH (ref 70–99)
Potassium: 3.1 mmol/L — ABNORMAL LOW (ref 3.5–5.1)
Sodium: 136 mmol/L (ref 135–145)
Total Bilirubin: 0.9 mg/dL (ref 0.3–1.2)
Total Protein: 7.1 g/dL (ref 6.5–8.1)

## 2019-06-24 LAB — CBC
HCT: 36.3 % (ref 36.0–46.0)
HCT: 33.2 % — ABNORMAL LOW (ref 36.0–46.0)
Hemoglobin: 11.3 g/dL — ABNORMAL LOW (ref 12.0–15.0)
Hemoglobin: 10.1 g/dL — ABNORMAL LOW (ref 12.0–15.0)
MCH: 27 pg (ref 26.0–34.0)
MCH: 26.6 pg (ref 26.0–34.0)
MCHC: 31.1 g/dL (ref 30.0–36.0)
MCHC: 30.4 g/dL (ref 30.0–36.0)
MCV: 86.6 fL (ref 80.0–100.0)
MCV: 87.4 fL (ref 80.0–100.0)
Platelets: 305 10*3/uL (ref 150–400)
Platelets: 276 10*3/uL (ref 150–400)
RBC: 4.19 MIL/uL (ref 3.87–5.11)
RBC: 3.8 MIL/uL — ABNORMAL LOW (ref 3.87–5.11)
RDW: 15.8 % — ABNORMAL HIGH (ref 11.5–15.5)
RDW: 15.9 % — ABNORMAL HIGH (ref 11.5–15.5)
WBC: 18 10*3/uL — ABNORMAL HIGH (ref 4.0–10.5)
WBC: 24.2 10*3/uL — ABNORMAL HIGH (ref 4.0–10.5)
nRBC: 0 % (ref 0.0–0.2)
nRBC: 0 % (ref 0.0–0.2)

## 2019-06-24 LAB — ETHANOL: Alcohol, Ethyl (B): 95 mg/dL — ABNORMAL HIGH (ref <10)

## 2019-06-24 LAB — PROTIME-INR
INR: 1 (ref 0.8–1.2)
Prothrombin Time: 12.9 seconds (ref 11.4–15.2)

## 2019-06-24 LAB — MRSA PCR SCREENING: MRSA by PCR: NEGATIVE

## 2019-06-24 LAB — HIV ANTIBODY (ROUTINE TESTING W REFLEX): HIV Screen 4th Generation wRfx: NONREACTIVE

## 2019-06-24 MED ORDER — ONDANSETRON HCL 4 MG/2ML IJ SOLN
4.0000 mg | Freq: Four times a day (QID) | INTRAMUSCULAR | Status: DC | PRN
  Administered 2019-06-24 (×3): 4 mg via INTRAVENOUS
  Filled 2019-06-24 (×3): qty 2

## 2019-06-24 MED ORDER — METHOCARBAMOL 500 MG PO TABS
500.0000 mg | ORAL_TABLET | Freq: Four times a day (QID) | ORAL | Status: DC | PRN
  Administered 2019-06-25 (×2): 500 mg via ORAL
  Filled 2019-06-24 (×3): qty 1

## 2019-06-24 MED ORDER — OXYCODONE HCL 5 MG PO TABS
10.0000 mg | ORAL_TABLET | ORAL | Status: DC | PRN
  Administered 2019-06-24 – 2019-06-25 (×2): 10 mg via ORAL
  Filled 2019-06-24 (×2): qty 2

## 2019-06-24 MED ORDER — ONDANSETRON 4 MG PO TBDP: 4.0000 mg | ORAL_TABLET | Freq: Four times a day (QID) | ORAL | Status: DC | PRN

## 2019-06-24 MED ORDER — CEFAZOLIN SODIUM-DEXTROSE 2-4 GM/100ML-% IV SOLN
2.0000 g | Freq: Three times a day (TID) | INTRAVENOUS | Status: DC
  Administered 2019-06-24 – 2019-06-25 (×4): 2 g via INTRAVENOUS
  Filled 2019-06-24 (×5): qty 100

## 2019-06-24 MED ORDER — MORPHINE SULFATE (PF) 4 MG/ML IV SOLN
4.0000 mg | Freq: Once | INTRAVENOUS | Status: AC
  Administered 2019-06-24: 4 mg via INTRAVENOUS

## 2019-06-24 MED ORDER — HYDRALAZINE HCL 20 MG/ML IJ SOLN: 10.0000 mg | INTRAMUSCULAR | Status: DC | PRN

## 2019-06-24 MED ORDER — ACETAMINOPHEN 500 MG PO TABS
1000.0000 mg | ORAL_TABLET | Freq: Four times a day (QID) | ORAL | Status: DC
  Administered 2019-06-24 (×2): 1000 mg via ORAL
  Administered 2019-06-24: 500 mg via ORAL
  Administered 2019-06-25: 1000 mg via ORAL
  Filled 2019-06-24 (×5): qty 2

## 2019-06-24 MED ORDER — METOPROLOL TARTRATE 5 MG/5ML IV SOLN: 5.0000 mg | Freq: Four times a day (QID) | INTRAVENOUS | Status: DC | PRN

## 2019-06-24 MED ORDER — ONDANSETRON HCL 4 MG/2ML IJ SOLN
4.0000 mg | Freq: Once | INTRAMUSCULAR | Status: AC
  Administered 2019-06-24: 4 mg via INTRAVENOUS

## 2019-06-24 MED ORDER — ONDANSETRON HCL 4 MG/2ML IJ SOLN
INTRAMUSCULAR | Status: AC
  Filled 2019-06-24: qty 2

## 2019-06-24 MED ORDER — DOCUSATE SODIUM 100 MG PO CAPS
100.0000 mg | ORAL_CAPSULE | Freq: Two times a day (BID) | ORAL | Status: DC
  Administered 2019-06-24 – 2019-06-27 (×6): 100 mg via ORAL
  Filled 2019-06-24 (×7): qty 1

## 2019-06-24 MED ORDER — PANTOPRAZOLE SODIUM 40 MG PO TBEC
40.0000 mg | DELAYED_RELEASE_TABLET | Freq: Every day | ORAL | Status: DC
  Administered 2019-06-24 – 2019-06-27 (×4): 40 mg via ORAL
  Filled 2019-06-24 (×4): qty 1

## 2019-06-24 MED ORDER — MORPHINE SULFATE (PF) 4 MG/ML IV SOLN
INTRAVENOUS | Status: AC
  Filled 2019-06-24: qty 1

## 2019-06-24 MED ORDER — PANTOPRAZOLE SODIUM 40 MG IV SOLR: 40.0000 mg | Freq: Every day | INTRAVENOUS | Status: DC

## 2019-06-24 MED ORDER — OXYCODONE HCL 5 MG PO TABS: 5.0000 mg | ORAL_TABLET | ORAL | Status: DC | PRN

## 2019-06-24 MED ORDER — GABAPENTIN 300 MG PO CAPS
300.0000 mg | ORAL_CAPSULE | Freq: Three times a day (TID) | ORAL | Status: DC
  Administered 2019-06-24 – 2019-06-25 (×3): 300 mg via ORAL
  Filled 2019-06-24 (×4): qty 1

## 2019-06-24 MED ORDER — PROMETHAZINE HCL 25 MG/ML IJ SOLN
12.5000 mg | INTRAMUSCULAR | Status: DC | PRN
  Administered 2019-06-25 – 2019-06-26 (×4): 12.5 mg via INTRAVENOUS
  Filled 2019-06-24 (×4): qty 1

## 2019-06-24 MED ORDER — SODIUM CHLORIDE 0.9 % IV SOLN: INTRAVENOUS | Status: DC

## 2019-06-24 MED ORDER — TETANUS-DIPHTH-ACELL PERTUSSIS 5-2.5-18.5 LF-MCG/0.5 IM SUSP
0.5000 mL | Freq: Once | INTRAMUSCULAR | Status: AC
  Administered 2019-06-24: 0.5 mL via INTRAMUSCULAR
  Filled 2019-06-24: qty 0.5

## 2019-06-24 MED ORDER — ONDANSETRON HCL 4 MG/2ML IJ SOLN
4.0000 mg | INTRAMUSCULAR | Status: DC | PRN
  Administered 2019-06-26 (×2): 4 mg via INTRAVENOUS
  Filled 2019-06-24 (×2): qty 2

## 2019-06-24 MED ORDER — IOHEXOL 300 MG/ML  SOLN
100.0000 mL | Freq: Once | INTRAMUSCULAR | Status: AC | PRN
  Administered 2019-06-24: 100 mL via INTRAVENOUS

## 2019-06-24 MED ORDER — HYDROMORPHONE HCL 1 MG/ML IJ SOLN
0.5000 mg | INTRAMUSCULAR | Status: DC | PRN
  Administered 2019-06-24 – 2019-06-25 (×9): 0.5 mg via INTRAVENOUS
  Filled 2019-06-24 (×9): qty 1

## 2019-06-24 NOTE — ED Notes (Signed)
Patient transported to CT 
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
## 2019-06-24 NOTE — ED Notes (Signed)
Neuro surgery at bedside.
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
## 2019-06-24 NOTE — Progress Notes (Signed)
Orthopedic Tech Progress Note Patient Details:  Sherry Powell [birth year] [id number]  Patient ID: Sherry Powell, female   DOB: 05-25-1995, 24 y.o.   MRN: [id number] Call was placed to [facility] for a clamshell spine brace,Med size for pt., RN notified  Sherry Powell 06/24/2019, 4:21 PM
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
## 2019-06-24 NOTE — Progress Notes (Signed)
Pt. Threatened to sign out AMA with her mother. Mother wants her to stay to get her care. Ortho tech arrived and applied clamshell. Security spoke with mother about visitation issues and Dr. Margaree Mackintosh notifed of patients statements. Security and [facility] present to address visitation issues. Gabriel Cirri RN

## 2019-06-24 NOTE — Progress Notes (Signed)
I updated her mother
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
## 2019-06-24 NOTE — ED Notes (Signed)
Pt having nausea requesting meds

## 2019-06-24 NOTE — ED Notes (Signed)
Mother has come in multiple times and says she would like an update  [phone].

## 2019-06-25 ENCOUNTER — Observation Stay (HOSPITAL_COMMUNITY): Payer: Medicaid Other

## 2019-06-25 DIAGNOSIS — S41131A Puncture wound without foreign body of right upper arm, initial encounter: Secondary | ICD-10-CM | POA: Diagnosis present

## 2019-06-25 DIAGNOSIS — W3400XA Accidental discharge from unspecified firearms or gun, initial encounter: Secondary | ICD-10-CM | POA: Diagnosis not present

## 2019-06-25 DIAGNOSIS — S42151B Displaced fracture of neck of scapula, right shoulder, initial encounter for open fracture: Secondary | ICD-10-CM | POA: Diagnosis present

## 2019-06-25 DIAGNOSIS — D649 Anemia, unspecified: Secondary | ICD-10-CM | POA: Diagnosis not present

## 2019-06-25 DIAGNOSIS — F17210 Nicotine dependence, cigarettes, uncomplicated: Secondary | ICD-10-CM | POA: Diagnosis present

## 2019-06-25 DIAGNOSIS — F10129 Alcohol abuse with intoxication, unspecified: Secondary | ICD-10-CM | POA: Diagnosis present

## 2019-06-25 DIAGNOSIS — S22070A Wedge compression fracture of T9-T10 vertebra, initial encounter for closed fracture: Secondary | ICD-10-CM | POA: Diagnosis present

## 2019-06-25 DIAGNOSIS — F12929 Cannabis use, unspecified with intoxication, unspecified: Secondary | ICD-10-CM | POA: Diagnosis present

## 2019-06-25 DIAGNOSIS — S31040A Puncture wound with foreign body of lower back and pelvis without penetration into retroperitoneum, initial encounter: Secondary | ICD-10-CM | POA: Diagnosis present

## 2019-06-25 DIAGNOSIS — S22060A Wedge compression fracture of T7-T8 vertebra, initial encounter for closed fracture: Secondary | ICD-10-CM | POA: Diagnosis present

## 2019-06-25 DIAGNOSIS — E876 Hypokalemia: Secondary | ICD-10-CM | POA: Diagnosis not present

## 2019-06-25 DIAGNOSIS — Y904 Blood alcohol level of 80-99 mg/100 ml: Secondary | ICD-10-CM | POA: Diagnosis present

## 2019-06-25 DIAGNOSIS — S32048B Other fracture of fourth lumbar vertebra, initial encounter for open fracture: Secondary | ICD-10-CM | POA: Diagnosis present

## 2019-06-25 DIAGNOSIS — Z20822 Contact with and (suspected) exposure to covid-19: Secondary | ICD-10-CM | POA: Diagnosis present

## 2019-06-25 DIAGNOSIS — S2220XA Unspecified fracture of sternum, initial encounter for closed fracture: Secondary | ICD-10-CM | POA: Diagnosis present

## 2019-06-25 DIAGNOSIS — K5900 Constipation, unspecified: Secondary | ICD-10-CM | POA: Diagnosis not present

## 2019-06-25 DIAGNOSIS — S41041A Puncture wound with foreign body of right shoulder, initial encounter: Secondary | ICD-10-CM | POA: Diagnosis present

## 2019-06-25 DIAGNOSIS — Y92410 Unspecified street and highway as the place of occurrence of the external cause: Secondary | ICD-10-CM | POA: Diagnosis not present

## 2019-06-25 LAB — BASIC METABOLIC PANEL
Anion gap: 10 (ref 5–15)
BUN: 7 mg/dL (ref 6–20)
CO2: 21 mmol/L — ABNORMAL LOW (ref 22–32)
Calcium: 8.7 mg/dL — ABNORMAL LOW (ref 8.9–10.3)
Chloride: 105 mmol/L (ref 98–111)
Creatinine, Ser: 0.67 mg/dL (ref 0.44–1.00)
GFR calc Af Amer: >60 mL/min (ref >60)
GFR calc non Af Amer: >60 mL/min (ref >60)
Glucose, Bld: 91 mg/dL (ref 70–99)
Potassium: 3.3 mmol/L — ABNORMAL LOW (ref 3.5–5.1)
Sodium: 136 mmol/L (ref 135–145)

## 2019-06-25 LAB — CBC
HCT: 32.7 % — ABNORMAL LOW (ref 36.0–46.0)
Hemoglobin: 10.2 g/dL — ABNORMAL LOW (ref 12.0–15.0)
MCH: 27 pg (ref 26.0–34.0)
MCHC: 31.2 g/dL (ref 30.0–36.0)
MCV: 86.5 fL (ref 80.0–100.0)
Platelets: 213 10*3/uL (ref 150–400)
RBC: 3.78 MIL/uL — ABNORMAL LOW (ref 3.87–5.11)
RDW: 15.8 % — ABNORMAL HIGH (ref 11.5–15.5)
WBC: 12 10*3/uL — ABNORMAL HIGH (ref 4.0–10.5)
nRBC: 0 % (ref 0.0–0.2)

## 2019-06-25 LAB — MAGNESIUM: Magnesium: 1.8 mg/dL (ref 1.7–2.4)

## 2019-06-25 MED ORDER — NICOTINE 7 MG/24HR TD PT24
7.0000 mg | MEDICATED_PATCH | Freq: Every day | TRANSDERMAL | Status: DC
  Administered 2019-06-25 – 2019-06-27 (×3): 7 mg via TRANSDERMAL
  Filled 2019-06-25 (×3): qty 1

## 2019-06-25 MED ORDER — ENOXAPARIN SODIUM 30 MG/0.3ML ~~LOC~~ SOLN
30.0000 mg | Freq: Two times a day (BID) | SUBCUTANEOUS | Status: DC
  Administered 2019-06-25 – 2019-06-27 (×4): 30 mg via SUBCUTANEOUS
  Filled 2019-06-25 (×4): qty 0.3

## 2019-06-25 MED ORDER — HYDROXYZINE HCL 10 MG PO TABS
10.0000 mg | ORAL_TABLET | Freq: Three times a day (TID) | ORAL | Status: DC | PRN
  Administered 2019-06-25: 10 mg via ORAL
  Filled 2019-06-25 (×2): qty 1

## 2019-06-25 MED ORDER — MAGNESIUM SULFATE 2 GM/50ML IV SOLN
2.0000 g | Freq: Once | INTRAVENOUS | Status: AC
  Administered 2019-06-25: 2 g via INTRAVENOUS
  Filled 2019-06-25: qty 50

## 2019-06-25 MED ORDER — ENOXAPARIN SODIUM 40 MG/0.4ML ~~LOC~~ SOLN
40.0000 mg | SUBCUTANEOUS | Status: DC
  Administered 2019-06-25: 40 mg via SUBCUTANEOUS
  Filled 2019-06-25: qty 0.4

## 2019-06-25 MED ORDER — ACETAMINOPHEN 160 MG/5ML PO SOLN: 1000.0000 mg | Freq: Four times a day (QID) | ORAL | Status: DC

## 2019-06-25 MED ORDER — HYDROMORPHONE HCL 1 MG/ML IJ SOLN
0.5000 mg | INTRAMUSCULAR | Status: DC | PRN
  Administered 2019-06-25 (×2): 0.5 mg via INTRAVENOUS
  Filled 2019-06-25 (×2): qty 1

## 2019-06-25 MED ORDER — OXYCODONE HCL 5 MG/5ML PO SOLN
5.0000 mg | ORAL | Status: DC | PRN
  Administered 2019-06-25 – 2019-06-26 (×2): 10 mg via ORAL
  Filled 2019-06-25 (×2): qty 10

## 2019-06-25 MED ORDER — POLYETHYLENE GLYCOL 3350 17 G PO PACK
17.0000 g | PACK | Freq: Every day | ORAL | Status: DC
  Administered 2019-06-25: 17 g via ORAL
  Filled 2019-06-25: qty 1

## 2019-06-25 MED ORDER — HYDROMORPHONE HCL 1 MG/ML IJ SOLN
0.2500 mg | Freq: Four times a day (QID) | INTRAMUSCULAR | Status: DC | PRN
  Administered 2019-06-25 – 2019-06-26 (×2): 0.25 mg via INTRAVENOUS
  Filled 2019-06-25 (×2): qty 1

## 2019-06-25 MED ORDER — POTASSIUM CHLORIDE CRYS ER 20 MEQ PO TBCR
40.0000 meq | EXTENDED_RELEASE_TABLET | Freq: Two times a day (BID) | ORAL | Status: AC
  Administered 2019-06-25 (×2): 40 meq via ORAL
  Filled 2019-06-25 (×2): qty 2

## 2019-06-25 MED ORDER — ACETAMINOPHEN 160 MG/5ML PO SOLN
1000.0000 mg | Freq: Four times a day (QID) | ORAL | Status: DC
  Administered 2019-06-25: 1000 mg via ORAL
  Filled 2019-06-25 (×3): qty 40.6

## 2019-06-25 MED ORDER — GABAPENTIN 250 MG/5ML PO SOLN
300.0000 mg | Freq: Three times a day (TID) | ORAL | Status: DC
  Administered 2019-06-25 – 2019-06-26 (×3): 300 mg via ORAL
  Filled 2019-06-25 (×4): qty 6
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
## 2019-06-25 NOTE — Progress Notes (Signed)
Orthopedic Tech Progress Note Patient Details:  Sherry Powell 1996/02/28 [id number]  Ortho Devices Type of Ortho Device: Shoulder immobilizer Ortho Device/Splint Location: RUE Ortho Device/Splint Interventions: Ordered, Other (comment)   Post Interventions Patient Tolerated: Other (comment) Instructions Provided: Care of device   Donald Pore 06/25/2019, 11:20 AM
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
## 2019-06-25 NOTE — Progress Notes (Signed)
Patient's mother is upset and requesting an update.
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
## 2019-06-25 NOTE — Progress Notes (Addendum)
2312 - pt N/V suspected aspiration RR: 30, O2: 95 (1L [place]), rhonchi, coughing. On call provider notified.   Recieved ordered for Phenergan 12.5 IV q4h, changed zofran to q4h, and for HOB to be elevated.  0000 pt refuses to wear cervical collar.

## 2019-06-26 LAB — BASIC METABOLIC PANEL
Anion gap: 8 (ref 5–15)
BUN: 7 mg/dL (ref 6–20)
CO2: 21 mmol/L — ABNORMAL LOW (ref 22–32)
Calcium: 9 mg/dL (ref 8.9–10.3)
Chloride: 108 mmol/L (ref 98–111)
Creatinine, Ser: 0.7 mg/dL (ref 0.44–1.00)
GFR calc Af Amer: >60 mL/min (ref >60)
GFR calc non Af Amer: >60 mL/min (ref >60)
Glucose, Bld: 96 mg/dL (ref 70–99)
Potassium: 4.1 mmol/L (ref 3.5–5.1)
Sodium: 137 mmol/L (ref 135–145)

## 2019-06-26 LAB — CBC
HCT: 34.3 % — ABNORMAL LOW (ref 36.0–46.0)
Hemoglobin: 10.5 g/dL — ABNORMAL LOW (ref 12.0–15.0)
MCH: 26 pg (ref 26.0–34.0)
MCHC: 30.6 g/dL (ref 30.0–36.0)
MCV: 84.9 fL (ref 80.0–100.0)
Platelets: 227 10*3/uL (ref 150–400)
RBC: 4.04 MIL/uL (ref 3.87–5.11)
RDW: 15.7 % — ABNORMAL HIGH (ref 11.5–15.5)
WBC: 10 10*3/uL (ref 4.0–10.5)
nRBC: 0 % (ref 0.0–0.2)

## 2019-06-26 LAB — MAGNESIUM: Magnesium: 2.1 mg/dL (ref 1.7–2.4)

## 2019-06-26 MED ORDER — OXYCODONE HCL 5 MG PO TABS
5.0000 mg | ORAL_TABLET | ORAL | Status: DC | PRN
  Administered 2019-06-26 – 2019-06-27 (×3): 10 mg via ORAL
  Filled 2019-06-26 (×3): qty 2

## 2019-06-26 MED ORDER — METHOCARBAMOL 500 MG PO TABS
500.0000 mg | ORAL_TABLET | Freq: Four times a day (QID) | ORAL | Status: DC
  Administered 2019-06-26 – 2019-06-27 (×5): 500 mg via ORAL
  Filled 2019-06-26 (×5): qty 1

## 2019-06-26 MED ORDER — ACETAMINOPHEN 500 MG PO TABS
1000.0000 mg | ORAL_TABLET | Freq: Four times a day (QID) | ORAL | Status: DC
  Administered 2019-06-26 – 2019-06-27 (×4): 1000 mg via ORAL
  Filled 2019-06-26 (×5): qty 2

## 2019-06-26 MED ORDER — POLYETHYLENE GLYCOL 3350 17 G PO PACK
17.0000 g | PACK | Freq: Two times a day (BID) | ORAL | Status: DC
  Administered 2019-06-26 – 2019-06-27 (×3): 17 g via ORAL
  Filled 2019-06-26 (×3): qty 1

## 2019-06-26 MED ORDER — HYDROMORPHONE HCL 1 MG/ML IJ SOLN
0.2500 mg | Freq: Three times a day (TID) | INTRAMUSCULAR | Status: DC | PRN
  Administered 2019-06-26 (×2): 0.25 mg via INTRAVENOUS
  Filled 2019-06-26 (×2): qty 1

## 2019-06-26 MED ORDER — GABAPENTIN 300 MG PO CAPS
300.0000 mg | ORAL_CAPSULE | Freq: Three times a day (TID) | ORAL | Status: DC
  Administered 2019-06-26 – 2019-06-27 (×5): 300 mg via ORAL
  Filled 2019-06-26 (×5): qty 1

## 2019-06-26 NOTE — Progress Notes (Addendum)
Pt became verbally aggressive and upset that she was not discharged today. RN explained that based off of her progress with PT the plan is for discharge tomorrow. Pt continued to be agitated and the option to leave AMA was explained to her. Pt said she wanted to get up and walk. RN walked with pt to bathroom and back to bed. Pt and her mother opting against AMA at this time. Consulting civil engineer notified. Will plan to send home therapy equipment home with mother tonight. Request for PT to see pt early in the day on 4/21 for potential discharge.
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
## 2019-06-26 NOTE — Social Work (Signed)
CSW met with pt at bedside. CSW introduced self and explained her role at the hospital. CSW completed sbirt. Pt scored a 4 on the sbirt scale. Pt states she drinks on the weekends with her friends but not through the week. Pt stated she uses marijuana daily and has no interest in stopping.   CSW offered resources, pt was receptive to resources.   Emeterio Reeve, Latanya Presser, [place] Social Worker [phone]
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
## 2019-06-26 NOTE — Progress Notes (Deleted)
Pt became verbally aggressive and was upset that she was not discharged today. RN explained that based off of her progress with PT she should be discharged tomorrow. Pt continued to be agitated and the option to leave AMA was explained to her. Pt wanted to get up and walk again. RN walked with pt to bathroom and back to bed. Pt and her mom opting against AMA at this time.
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
## 2019-06-26 NOTE — Discharge Instructions (Addendum)
Clamshell back brace: -Wear bracewhen upright and out of bed, no need to wear when laying in bed  What is a clamshell brace? A clamshell brace is a device that wraps around your back, chest, and stomach area and holds your spine in place. It is made of plastic and fits the shape of your body.  How does a clamshell brace work? A clamshell brace prevents you from bending forward or backward. It also keeps you from twisting side to side. Some clamshell braces extend to the upper thigh to provide more support to your lower spine. You may need a clamshell brace if you have a spinal fracture, or a spinal cord injury. A clamshell brace can help ease pain, protect your spine from injury, and allow an injury to heal.  What do I need to know about how to wear my clamshell brace? Always wear the brace according to your healthcare provider's instructions. Your provider will tell you how often and how long you need to wear the brace. Do not try to lift anything that weighs more than 10 pounds. Always have someone help you when you put on or take off your brace.  How do I care for my skin? Always wear a clean, dry cotton shirt under your clamshell brace. The shirt will help absorb sweat and protect your skin. A small amount of powder may also help reduce the amount of moisture on the skin beneath the brace. Check all areas of your skin beneath the brace every day. If you find red or irritated areas, check the position of your brace to make sure it is not too tight or too loose. If you have a rash, try changing your T-shirt more often. This can help if the rash is caused by heat, sweat, or laundry products. Talk to your healthcare provider about showering. You may be able to take off the brace to shower. You also may be able to shower while you wear your brace. If you shower with the brace on, be sure to thoroughly dry the brace and the skin under the brace.  When should I contact my healthcare provider? Your  skin is sore, red, or irritated because your brace rubs against it. Your arms, hands, or legs are numb or tingling. Your body changes in size or shape, and your brace feels loose or tight. You have increasing pain even while you wear the brace. You have new or worsening weakness in your arms or legs. You have questions or concerns about your brace.

## 2019-06-27 ENCOUNTER — Encounter (HOSPITAL_BASED_OUTPATIENT_CLINIC_OR_DEPARTMENT_OTHER): Payer: Self-pay | Admitting: Emergency Medicine

## 2019-06-27 MED ORDER — MAGNESIUM HYDROXIDE 400 MG/5ML PO SUSP
15.0000 mL | Freq: Once | ORAL | Status: AC
  Administered 2019-06-27: 15 mL via ORAL
  Filled 2019-06-27: qty 30

## 2019-06-27 MED ORDER — GABAPENTIN 300 MG PO CAPS: 300.0000 mg | ORAL_CAPSULE | Freq: Three times a day (TID) | ORAL | 1 refills | Status: DC

## 2019-06-27 MED ORDER — ACETAMINOPHEN 500 MG PO TABS: 1000.0000 mg | ORAL_TABLET | Freq: Four times a day (QID) | ORAL | 0 refills | Status: AC | PRN

## 2019-06-27 MED ORDER — OXYCODONE HCL 5 MG PO TABS: 5.0000 mg | ORAL_TABLET | Freq: Four times a day (QID) | ORAL | 0 refills | Status: DC | PRN

## 2019-06-27 MED ORDER — POLYETHYLENE GLYCOL 3350 17 G PO PACK: 17.0000 g | PACK | Freq: Every day | ORAL | 0 refills | Status: DC | PRN

## 2019-06-27 MED ORDER — METHOCARBAMOL 500 MG PO TABS: 500.0000 mg | ORAL_TABLET | Freq: Four times a day (QID) | ORAL | 0 refills | Status: DC | PRN

## 2019-06-27 MED FILL — oxyCODONE HCL 5 MG TABS: 5 | 4 days supply | Qty: 25 | Fill #0

## 2019-06-27 MED FILL — METHOCARBAMOL 500 MG TABS: 500 | 8 days supply | Qty: 30 | Fill #0

## 2019-06-27 MED FILL — GABAPENTIN 300 MG CAPSULE: 300 | 20 days supply | Qty: 60 | Fill #0

## 2019-06-27 NOTE — Progress Notes (Signed)
Pt worked with PT and OT this am and ready for d/c. DME rolling walker ordered for pt, PIV removed, and pain medication given prior to pt leaving unit. All discharge paperwork and education gone over with pt and pt's mother by RN with no questions. Pt taken out to private vehicle in wheelchair at 1330.
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
## 2019-06-27 NOTE — TOC Transition Note (Signed)
Transition of Care [facility]) - CM/SW Discharge Note   Patient Details  Name: Sherry Powell MRN: [id number] Date of Birth: 01/14/1996  [facility] CM/SW Contact:  Glennon Mac, RN Phone Number: 06/27/2019, 12:57 PM   Clinical Narrative:  Pt for discharge today.  RW added to recommended DME.  Notified [employer] for RW to be delivered to bedside prior to dc.       Final next level of care: OP Rehab Barriers to Discharge: Barriers Resolved   Patient Goals and CMS Choice Patient states their goals for this hospitalization and ongoing recovery are:: Pt was unable to vocolize goals.                            Discharge Plan and Services   Discharge Planning Services: CM Consult, [facility], [facility], Medication Assistance, Follow-up appt scheduled            DME Arranged: 3-N-1, Walker rolling DME Agency: [employer] Date DME Agency Contacted: 06/26/19 Time DME Agency Contacted: [phone] Representative spoke with at DME Agency: Christeen Douglas            Social Determinants of Health (SDOH) Interventions     Readmission Risk Interventions Readmission Risk Prevention Plan 06/26/2019  Post Dischage Appt Complete  Medication Screening Complete  Transportation Screening Complete   Quintella Baton, RN, BSN  Trauma/Neuro ICU Case Manager [phone]

## 2019-07-18 ENCOUNTER — Telehealth (INDEPENDENT_AMBULATORY_CARE_PROVIDER_SITE_OTHER): Payer: Self-pay | Admitting: Primary Care

## 2019-08-20 ENCOUNTER — Ambulatory Visit: Payer: MEDICAID | Attending: Physician Assistant | Admitting: Rehabilitation

## 2019-09-05 ENCOUNTER — Ambulatory Visit: Payer: MEDICAID | Admitting: Occupational Therapy

## 2019-10-16 ENCOUNTER — Other Ambulatory Visit: Payer: Self-pay

## 2019-10-16 ENCOUNTER — Encounter (HOSPITAL_BASED_OUTPATIENT_CLINIC_OR_DEPARTMENT_OTHER): Payer: Self-pay | Admitting: *Deleted

## 2019-10-16 ENCOUNTER — Emergency Department (HOSPITAL_BASED_OUTPATIENT_CLINIC_OR_DEPARTMENT_OTHER)
Admission: EM | Admit: 2019-10-16 | Discharge: 2019-10-16 | Disposition: A | Discharge status: Home / Self Care | Payer: Medicaid Other | Attending: Emergency Medicine | Admitting: Emergency Medicine

## 2019-10-16 DIAGNOSIS — W228XXA Striking against or struck by other objects, initial encounter: Secondary | ICD-10-CM | POA: Diagnosis not present

## 2019-10-16 DIAGNOSIS — Y9289 Other specified places as the place of occurrence of the external cause: Secondary | ICD-10-CM | POA: Diagnosis not present

## 2019-10-16 DIAGNOSIS — S01511A Laceration without foreign body of lip, initial encounter: Secondary | ICD-10-CM | POA: Insufficient documentation

## 2019-10-16 DIAGNOSIS — J45909 Unspecified asthma, uncomplicated: Secondary | ICD-10-CM | POA: Diagnosis not present

## 2019-10-16 DIAGNOSIS — Y9389 Activity, other specified: Secondary | ICD-10-CM | POA: Diagnosis not present

## 2019-10-16 DIAGNOSIS — F1290 Cannabis use, unspecified, uncomplicated: Secondary | ICD-10-CM | POA: Insufficient documentation

## 2019-10-16 DIAGNOSIS — F17210 Nicotine dependence, cigarettes, uncomplicated: Secondary | ICD-10-CM | POA: Insufficient documentation

## 2019-10-16 DIAGNOSIS — Y998 Other external cause status: Secondary | ICD-10-CM | POA: Insufficient documentation
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
## 2019-10-16 NOTE — Discharge Instructions (Signed)
Your laceration in mouth is superficial enough it does not require repair with a needle and thread.  Please rinse your mouth out with warm water for the next 24 hours and then you may start using saline which is salt water rinses.  Please keep an eye on your mouth to make sure that it continues to heal appropriately.  This should improve rather than worsen over the next 3 to 4 days.

## 2019-11-06 ENCOUNTER — Emergency Department (HOSPITAL_BASED_OUTPATIENT_CLINIC_OR_DEPARTMENT_OTHER)
Admission: EM | Admit: 2019-11-06 | Discharge: 2019-11-06 | Disposition: A | Discharge status: Home / Self Care | Payer: Medicaid Other | Attending: Emergency Medicine | Admitting: Emergency Medicine

## 2019-11-06 ENCOUNTER — Encounter (HOSPITAL_BASED_OUTPATIENT_CLINIC_OR_DEPARTMENT_OTHER): Payer: Self-pay | Admitting: *Deleted

## 2019-11-06 ENCOUNTER — Other Ambulatory Visit: Payer: Self-pay

## 2019-11-06 DIAGNOSIS — Z5321 Procedure and treatment not carried out due to patient leaving prior to being seen by health care provider: Secondary | ICD-10-CM | POA: Insufficient documentation

## 2019-11-06 DIAGNOSIS — R109 Unspecified abdominal pain: Secondary | ICD-10-CM | POA: Diagnosis present

## 2019-11-06 NOTE — ED Triage Notes (Signed)
Pt reports cramping since this am, states it is time for her menstrual cycle, "I always cramp but these are so much worse". Last bm this am, normal, denies any bleeding or discharge.

## 2020-07-26 ENCOUNTER — Emergency Department (HOSPITAL_BASED_OUTPATIENT_CLINIC_OR_DEPARTMENT_OTHER)
Admission: EM | Admit: 2020-07-26 | Discharge: 2020-07-26 | Disposition: A | Discharge status: Home / Self Care | Payer: Medicaid Other | Attending: Emergency Medicine | Admitting: Emergency Medicine

## 2020-07-26 ENCOUNTER — Encounter (HOSPITAL_BASED_OUTPATIENT_CLINIC_OR_DEPARTMENT_OTHER): Payer: Self-pay | Admitting: Emergency Medicine

## 2020-07-26 ENCOUNTER — Other Ambulatory Visit: Payer: Self-pay

## 2020-07-26 ENCOUNTER — Emergency Department (HOSPITAL_BASED_OUTPATIENT_CLINIC_OR_DEPARTMENT_OTHER): Payer: Medicaid Other

## 2020-07-26 DIAGNOSIS — Y990 Civilian activity done for income or pay: Secondary | ICD-10-CM | POA: Insufficient documentation

## 2020-07-26 DIAGNOSIS — W231XXA Caught, crushed, jammed, or pinched between stationary objects, initial encounter: Secondary | ICD-10-CM | POA: Diagnosis not present

## 2020-07-26 DIAGNOSIS — S60011A Contusion of right thumb without damage to nail, initial encounter: Secondary | ICD-10-CM | POA: Diagnosis not present

## 2020-07-26 DIAGNOSIS — F17210 Nicotine dependence, cigarettes, uncomplicated: Secondary | ICD-10-CM | POA: Insufficient documentation

## 2020-07-26 DIAGNOSIS — S6991XA Unspecified injury of right wrist, hand and finger(s), initial encounter: Secondary | ICD-10-CM | POA: Diagnosis present

## 2020-07-26 DIAGNOSIS — J45909 Unspecified asthma, uncomplicated: Secondary | ICD-10-CM | POA: Insufficient documentation

## 2020-07-26 MED ORDER — ACETAMINOPHEN 500 MG PO TABS
1000.0000 mg | ORAL_TABLET | Freq: Once | ORAL | Status: AC
  Administered 2020-07-26: 1000 mg via ORAL
  Filled 2020-07-26: qty 2

## 2020-07-26 MED ORDER — IBUPROFEN 800 MG PO TABS
800.0000 mg | ORAL_TABLET | Freq: Once | ORAL | Status: AC
  Administered 2020-07-26: 800 mg via ORAL
  Filled 2020-07-26: qty 1

## 2020-07-26 MED ORDER — IBUPROFEN 400 MG PO TABS: 400.0000 mg | ORAL_TABLET | Freq: Four times a day (QID) | ORAL | 0 refills | Status: AC | PRN
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
## 2020-08-27 ENCOUNTER — Emergency Department (HOSPITAL_BASED_OUTPATIENT_CLINIC_OR_DEPARTMENT_OTHER)
Admission: EM | Admit: 2020-08-27 | Discharge: 2020-08-27 | Disposition: A | Discharge status: Home / Self Care | Payer: Medicaid Other | Attending: Emergency Medicine | Admitting: Emergency Medicine

## 2020-08-27 ENCOUNTER — Emergency Department (HOSPITAL_BASED_OUTPATIENT_CLINIC_OR_DEPARTMENT_OTHER): Payer: Medicaid Other

## 2020-08-27 ENCOUNTER — Other Ambulatory Visit: Payer: Self-pay

## 2020-08-27 ENCOUNTER — Encounter (HOSPITAL_BASED_OUTPATIENT_CLINIC_OR_DEPARTMENT_OTHER): Payer: Self-pay

## 2020-08-27 DIAGNOSIS — F17210 Nicotine dependence, cigarettes, uncomplicated: Secondary | ICD-10-CM | POA: Insufficient documentation

## 2020-08-27 DIAGNOSIS — R112 Nausea with vomiting, unspecified: Secondary | ICD-10-CM | POA: Insufficient documentation

## 2020-08-27 DIAGNOSIS — J45909 Unspecified asthma, uncomplicated: Secondary | ICD-10-CM | POA: Insufficient documentation

## 2020-08-27 DIAGNOSIS — R1032 Left lower quadrant pain: Secondary | ICD-10-CM | POA: Insufficient documentation

## 2020-08-27 LAB — COMPREHENSIVE METABOLIC PANEL
ALT: 11 U/L (ref 0–44)
AST: 19 U/L (ref 15–41)
Albumin: 4 g/dL (ref 3.5–5.0)
Alkaline Phosphatase: 50 U/L (ref 38–126)
Anion gap: 10 (ref 5–15)
BUN: 9 mg/dL (ref 6–20)
CO2: 22 mmol/L (ref 22–32)
Calcium: 9.5 mg/dL (ref 8.9–10.3)
Chloride: 106 mmol/L (ref 98–111)
Creatinine, Ser: 0.58 mg/dL (ref 0.44–1.00)
GFR, Estimated: >60 mL/min (ref >60)
Glucose, Bld: 102 mg/dL — ABNORMAL HIGH (ref 70–99)
Potassium: 3.8 mmol/L (ref 3.5–5.1)
Sodium: 138 mmol/L (ref 135–145)
Total Bilirubin: 0.6 mg/dL (ref 0.3–1.2)
Total Protein: 7.9 g/dL (ref 6.5–8.1)

## 2020-08-27 LAB — CBC
HCT: 35.4 % — ABNORMAL LOW (ref 36.0–46.0)
Hemoglobin: 11.8 g/dL — ABNORMAL LOW (ref 12.0–15.0)
MCH: 29.1 pg (ref 26.0–34.0)
MCHC: 33.3 g/dL (ref 30.0–36.0)
MCV: 87.2 fL (ref 80.0–100.0)
Platelets: 282 10*3/uL (ref 150–400)
RBC: 4.06 MIL/uL (ref 3.87–5.11)
RDW: 13.3 % (ref 11.5–15.5)
WBC: 9.3 10*3/uL (ref 4.0–10.5)
nRBC: 0 % (ref 0.0–0.2)

## 2020-08-27 LAB — LIPASE, BLOOD: Lipase: 35 U/L (ref 11–51)

## 2020-08-27 LAB — HCG, SERUM, QUALITATIVE: Preg, Serum: NEGATIVE

## 2020-08-27 MED ORDER — SODIUM CHLORIDE 0.9 % IV SOLN: Freq: Once | INTRAVENOUS | Status: AC

## 2020-08-27 MED ORDER — MORPHINE SULFATE (PF) 4 MG/ML IV SOLN
4.0000 mg | Freq: Once | INTRAVENOUS | Status: AC
  Administered 2020-08-27: 4 mg via INTRAVENOUS
  Filled 2020-08-27: qty 1

## 2020-08-27 MED ORDER — DROPERIDOL 2.5 MG/ML IJ SOLN: 2.5000 mg | Freq: Once | INTRAMUSCULAR | Status: DC

## 2020-08-27 MED ORDER — SODIUM CHLORIDE 0.9 % IV SOLN: INTRAVENOUS | Status: DC | PRN

## 2020-08-27 MED ORDER — ONDANSETRON HCL 4 MG/2ML IJ SOLN
4.0000 mg | Freq: Once | INTRAMUSCULAR | Status: AC
  Administered 2020-08-27: 4 mg via INTRAVENOUS
  Filled 2020-08-27: qty 2
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
## 2020-08-27 NOTE — ED Notes (Addendum)
Pt requesting IV be taken out. Family member came to this RN to say that they need to leave to go to their kids school. Provider notified and speaking with patient about leaving AMA. Pt signed AMA form.

## 2020-08-27 NOTE — ED Triage Notes (Signed)
Woke up with lower abdominal pain & emesis x10. Denies urinary symptoms. Pt moaning and writhing in pain during triage.

## 2020-12-07 ENCOUNTER — Emergency Department (HOSPITAL_BASED_OUTPATIENT_CLINIC_OR_DEPARTMENT_OTHER)
Admission: EM | Admit: 2020-12-07 | Discharge: 2020-12-07 | Disposition: A | Discharge status: Home / Self Care | Payer: Medicaid Other | Attending: Emergency Medicine | Admitting: Emergency Medicine

## 2020-12-07 ENCOUNTER — Encounter (HOSPITAL_BASED_OUTPATIENT_CLINIC_OR_DEPARTMENT_OTHER): Payer: Self-pay | Admitting: Emergency Medicine

## 2020-12-07 ENCOUNTER — Other Ambulatory Visit: Payer: Self-pay

## 2020-12-07 ENCOUNTER — Emergency Department (HOSPITAL_BASED_OUTPATIENT_CLINIC_OR_DEPARTMENT_OTHER): Payer: Medicaid Other

## 2020-12-07 DIAGNOSIS — W108XXA Fall (on) (from) other stairs and steps, initial encounter: Secondary | ICD-10-CM | POA: Insufficient documentation

## 2020-12-07 DIAGNOSIS — J45909 Unspecified asthma, uncomplicated: Secondary | ICD-10-CM | POA: Diagnosis not present

## 2020-12-07 DIAGNOSIS — F17210 Nicotine dependence, cigarettes, uncomplicated: Secondary | ICD-10-CM | POA: Insufficient documentation

## 2020-12-07 DIAGNOSIS — M545 Low back pain, unspecified: Secondary | ICD-10-CM | POA: Diagnosis not present

## 2020-12-07 LAB — PREGNANCY, URINE: Preg Test, Ur: NEGATIVE

## 2020-12-07 MED ORDER — KETOROLAC TROMETHAMINE 30 MG/ML IJ SOLN
30.0000 mg | Freq: Once | INTRAMUSCULAR | Status: AC
  Administered 2020-12-07: 30 mg via INTRAMUSCULAR
  Filled 2020-12-07: qty 1

## 2020-12-07 MED ORDER — LIDOCAINE 5 % EX PTCH: 1.0000 | MEDICATED_PATCH | CUTANEOUS | 0 refills | Status: DC

## 2020-12-07 MED ORDER — NAPROXEN 375 MG PO TABS: 375.0000 mg | ORAL_TABLET | Freq: Two times a day (BID) | ORAL | 0 refills | Status: DC

## 2020-12-07 MED ORDER — LIDOCAINE 5 % EX PTCH
1.0000 | MEDICATED_PATCH | CUTANEOUS | Status: DC
  Administered 2020-12-07: 1 via TRANSDERMAL
  Filled 2020-12-07: qty 1

## 2020-12-07 NOTE — Discharge Instructions (Signed)
Apply lidoderm patch daily if helps.  Take naproxen twice daily with food and water.  Follow up with PCP if no improvement in the next week or so. Return if worse.

## 2020-12-07 NOTE — ED Triage Notes (Signed)
Pt fell/slipped down 3 steps this morning; c/o LT lower back pain, making it difficult to ambulate

## 2020-12-07 NOTE — ED Notes (Signed)
Imaging tbd s/p preg test results. 
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
## 2021-01-17 ENCOUNTER — Other Ambulatory Visit: Payer: Self-pay

## 2021-01-17 ENCOUNTER — Encounter (HOSPITAL_BASED_OUTPATIENT_CLINIC_OR_DEPARTMENT_OTHER): Payer: Self-pay | Admitting: Emergency Medicine

## 2021-01-17 ENCOUNTER — Emergency Department (HOSPITAL_BASED_OUTPATIENT_CLINIC_OR_DEPARTMENT_OTHER)
Admission: EM | Admit: 2021-01-17 | Discharge: 2021-01-17 | Discharge status: Against Medical Advice | Payer: Medicaid Other | Attending: Student | Admitting: Student

## 2021-01-17 DIAGNOSIS — O219 Vomiting of pregnancy, unspecified: Secondary | ICD-10-CM | POA: Diagnosis not present

## 2021-01-17 DIAGNOSIS — O9A211 Injury, poisoning and certain other consequences of external causes complicating pregnancy, first trimester: Secondary | ICD-10-CM | POA: Insufficient documentation

## 2021-01-17 DIAGNOSIS — Z3A01 Less than 8 weeks gestation of pregnancy: Secondary | ICD-10-CM | POA: Diagnosis not present

## 2021-01-17 DIAGNOSIS — J45909 Unspecified asthma, uncomplicated: Secondary | ICD-10-CM | POA: Diagnosis not present

## 2021-01-17 DIAGNOSIS — W109XXA Fall (on) (from) unspecified stairs and steps, initial encounter: Secondary | ICD-10-CM | POA: Diagnosis not present

## 2021-01-17 DIAGNOSIS — W19XXXA Unspecified fall, initial encounter: Secondary | ICD-10-CM

## 2021-01-17 DIAGNOSIS — F17210 Nicotine dependence, cigarettes, uncomplicated: Secondary | ICD-10-CM | POA: Insufficient documentation

## 2021-01-17 DIAGNOSIS — Y92009 Unspecified place in unspecified non-institutional (private) residence as the place of occurrence of the external cause: Secondary | ICD-10-CM | POA: Insufficient documentation

## 2021-01-17 DIAGNOSIS — S299XXA Unspecified injury of thorax, initial encounter: Secondary | ICD-10-CM | POA: Diagnosis not present

## 2021-01-17 DIAGNOSIS — R1013 Epigastric pain: Secondary | ICD-10-CM | POA: Insufficient documentation

## 2021-01-17 LAB — CBC WITH DIFFERENTIAL/PLATELET
Abs Immature Granulocytes: 0.05 10*3/uL (ref 0.00–0.07)
Basophils Absolute: 0.1 10*3/uL (ref 0.0–0.1)
Basophils Relative: 1 %
Eosinophils Absolute: 0.2 10*3/uL (ref 0.0–0.5)
Eosinophils Relative: 2 %
HCT: 37.4 % (ref 36.0–46.0)
Hemoglobin: 12.5 g/dL (ref 12.0–15.0)
Immature Granulocytes: 1 %
Lymphocytes Relative: 24 %
Lymphs Abs: 2.6 10*3/uL (ref 0.7–4.0)
MCH: 29.1 pg (ref 26.0–34.0)
MCHC: 33.4 g/dL (ref 30.0–36.0)
MCV: 87.2 fL (ref 80.0–100.0)
Monocytes Absolute: 0.8 10*3/uL (ref 0.1–1.0)
Monocytes Relative: 7 %
Neutro Abs: 7.2 10*3/uL (ref 1.7–7.7)
Neutrophils Relative %: 65 %
Platelets: 305 10*3/uL (ref 150–400)
RBC: 4.29 MIL/uL (ref 3.87–5.11)
RDW: 15.6 % — ABNORMAL HIGH (ref 11.5–15.5)
WBC: 10.9 10*3/uL — ABNORMAL HIGH (ref 4.0–10.5)
nRBC: 0 % (ref 0.0–0.2)

## 2021-01-17 LAB — URINALYSIS, ROUTINE W REFLEX MICROSCOPIC
Bilirubin Urine: NEGATIVE
Glucose, UA: NEGATIVE mg/dL
Hgb urine dipstick: NEGATIVE
Ketones, ur: NEGATIVE mg/dL
Nitrite: NEGATIVE
Protein, ur: NEGATIVE mg/dL
Specific Gravity, Urine: 1.025 (ref 1.005–1.030)
pH: 6.5 (ref 5.0–8.0)

## 2021-01-17 LAB — COMPREHENSIVE METABOLIC PANEL
ALT: 17 U/L (ref 0–44)
AST: 26 U/L (ref 15–41)
Albumin: 4 g/dL (ref 3.5–5.0)
Alkaline Phosphatase: 46 U/L (ref 38–126)
Anion gap: 10 (ref 5–15)
BUN: 11 mg/dL (ref 6–20)
CO2: 21 mmol/L — ABNORMAL LOW (ref 22–32)
Calcium: 9.4 mg/dL (ref 8.9–10.3)
Chloride: 106 mmol/L (ref 98–111)
Creatinine, Ser: 0.74 mg/dL (ref 0.44–1.00)
GFR, Estimated: >60 mL/min (ref >60)
Glucose, Bld: 92 mg/dL (ref 70–99)
Potassium: 3.7 mmol/L (ref 3.5–5.1)
Sodium: 137 mmol/L (ref 135–145)
Total Bilirubin: 0.4 mg/dL (ref 0.3–1.2)
Total Protein: 8.1 g/dL (ref 6.5–8.1)

## 2021-01-17 LAB — URINALYSIS, MICROSCOPIC (REFLEX): RBC / HPF: NONE SEEN RBC/hpf (ref 0–5)

## 2021-01-17 LAB — PREGNANCY, URINE: Preg Test, Ur: POSITIVE — AB

## 2021-01-17 MED ORDER — PYRIDOXINE HCL 100 MG/ML IJ SOLN
100.0000 mg | Freq: Once | INTRAMUSCULAR | Status: AC
  Administered 2021-01-17: 100 mg via INTRAVENOUS
  Filled 2021-01-17: qty 1

## 2021-01-17 MED ORDER — LACTATED RINGERS IV BOLUS
1000.0000 mL | Freq: Once | INTRAVENOUS | Status: AC
  Administered 2021-01-17: 1000 mL via INTRAVENOUS

## 2021-01-17 MED ORDER — LIDOCAINE 5 % EX PTCH
1.0000 | MEDICATED_PATCH | CUTANEOUS | Status: DC
  Administered 2021-01-17: 1 via TRANSDERMAL
  Filled 2021-01-17: qty 1

## 2021-01-17 MED ORDER — DIPHENHYDRAMINE HCL 50 MG/ML IJ SOLN
25.0000 mg | Freq: Once | INTRAMUSCULAR | Status: AC
  Administered 2021-01-17: 25 mg via INTRAVENOUS
  Filled 2021-01-17: qty 1

## 2021-01-17 MED ORDER — PROCHLORPERAZINE EDISYLATE 10 MG/2ML IJ SOLN
10.0000 mg | Freq: Once | INTRAMUSCULAR | Status: AC
  Administered 2021-01-17: 10 mg via INTRAVENOUS
  Filled 2021-01-17: qty 2

## 2021-01-17 NOTE — ED Notes (Signed)
Pt called out and said she had to leave right now due to a crisis at home. Informed pt that she was leaving AMA and about risks of leaving. Pt verbalized understanding. Did not want to wait to talk to EDP or final vitals. Pt alert, oriented, and ambulatory out of ED. No longer having emesis. EDP informed.

## 2021-01-17 NOTE — ED Triage Notes (Addendum)
Pt slipped down steps last night landing on right lower back. Pt c/o right sided lower back pain, abd pain and vomiting. Pt states she is approximately [redacted] weeks pregnant according to home pregnancy test.
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
## 2021-03-08 ENCOUNTER — Other Ambulatory Visit: Payer: Self-pay

## 2021-03-08 ENCOUNTER — Emergency Department (HOSPITAL_BASED_OUTPATIENT_CLINIC_OR_DEPARTMENT_OTHER)
Admission: EM | Admit: 2021-03-08 | Discharge: 2021-03-08 | Disposition: A | Discharge status: Home / Self Care | Payer: Medicaid Other | Attending: Emergency Medicine | Admitting: Emergency Medicine

## 2021-03-08 ENCOUNTER — Encounter (HOSPITAL_BASED_OUTPATIENT_CLINIC_OR_DEPARTMENT_OTHER): Payer: Self-pay | Admitting: Emergency Medicine

## 2021-03-08 DIAGNOSIS — R112 Nausea with vomiting, unspecified: Secondary | ICD-10-CM | POA: Diagnosis not present

## 2021-03-08 DIAGNOSIS — R197 Diarrhea, unspecified: Secondary | ICD-10-CM | POA: Insufficient documentation

## 2021-03-08 DIAGNOSIS — N9489 Other specified conditions associated with female genital organs and menstrual cycle: Secondary | ICD-10-CM | POA: Diagnosis not present

## 2021-03-08 DIAGNOSIS — R1013 Epigastric pain: Secondary | ICD-10-CM | POA: Insufficient documentation

## 2021-03-08 LAB — CBC WITH DIFFERENTIAL/PLATELET
Abs Immature Granulocytes: 0.04 10*3/uL (ref 0.00–0.07)
Basophils Absolute: 0 10*3/uL (ref 0.0–0.1)
Basophils Relative: 0 %
Eosinophils Absolute: 0 10*3/uL (ref 0.0–0.5)
Eosinophils Relative: 0 %
HCT: 36.7 % (ref 36.0–46.0)
Hemoglobin: 12.3 g/dL (ref 12.0–15.0)
Immature Granulocytes: 0 %
Lymphocytes Relative: 9 %
Lymphs Abs: 1 10*3/uL (ref 0.7–4.0)
MCH: 29.6 pg (ref 26.0–34.0)
MCHC: 33.5 g/dL (ref 30.0–36.0)
MCV: 88.4 fL (ref 80.0–100.0)
Monocytes Absolute: 0.6 10*3/uL (ref 0.1–1.0)
Monocytes Relative: 5 %
Neutro Abs: 9.7 10*3/uL — ABNORMAL HIGH (ref 1.7–7.7)
Neutrophils Relative %: 86 %
Platelets: 251 10*3/uL (ref 150–400)
RBC: 4.15 MIL/uL (ref 3.87–5.11)
RDW: 14.8 % (ref 11.5–15.5)
WBC: 11.4 10*3/uL — ABNORMAL HIGH (ref 4.0–10.5)
nRBC: 0 % (ref 0.0–0.2)

## 2021-03-08 LAB — COMPREHENSIVE METABOLIC PANEL
ALT: 16 U/L (ref 0–44)
AST: 32 U/L (ref 15–41)
Albumin: 4.1 g/dL (ref 3.5–5.0)
Alkaline Phosphatase: 50 U/L (ref 38–126)
Anion gap: 11 (ref 5–15)
BUN: 13 mg/dL (ref 6–20)
CO2: 18 mmol/L — ABNORMAL LOW (ref 22–32)
Calcium: 9.2 mg/dL (ref 8.9–10.3)
Chloride: 108 mmol/L (ref 98–111)
Creatinine, Ser: 0.62 mg/dL (ref 0.44–1.00)
GFR, Estimated: >60 mL/min (ref >60)
Glucose, Bld: 125 mg/dL — ABNORMAL HIGH (ref 70–99)
Potassium: 3.5 mmol/L (ref 3.5–5.1)
Sodium: 137 mmol/L (ref 135–145)
Total Bilirubin: 0.7 mg/dL (ref 0.3–1.2)
Total Protein: 8 g/dL (ref 6.5–8.1)

## 2021-03-08 LAB — LIPASE, BLOOD: Lipase: 54 U/L — ABNORMAL HIGH (ref 11–51)

## 2021-03-08 LAB — HCG, SERUM, QUALITATIVE: Preg, Serum: NEGATIVE

## 2021-03-08 MED ORDER — SODIUM CHLORIDE 0.9 % IV BOLUS
1000.0000 mL | Freq: Once | INTRAVENOUS | Status: AC
  Administered 2021-03-08: 1000 mL via INTRAVENOUS

## 2021-03-08 MED ORDER — ONDANSETRON HCL 4 MG PO TABS: 4.0000 mg | ORAL_TABLET | Freq: Four times a day (QID) | ORAL | 0 refills | Status: DC

## 2021-03-08 MED ORDER — ONDANSETRON HCL 4 MG/2ML IJ SOLN
4.0000 mg | Freq: Once | INTRAMUSCULAR | Status: AC
Start: 2021-03-08 — End: 2021-03-08
  Administered 2021-03-08: 4 mg via INTRAVENOUS
  Filled 2021-03-08: qty 2

## 2021-03-08 MED ORDER — SODIUM CHLORIDE 0.9 % IV SOLN: INTRAVENOUS | Status: DC | PRN

## 2021-03-08 MED ORDER — FENTANYL CITRATE PF 50 MCG/ML IJ SOSY
50.0000 ug | PREFILLED_SYRINGE | Freq: Once | INTRAMUSCULAR | Status: AC
  Administered 2021-03-08: 50 ug via INTRAVENOUS
  Filled 2021-03-08: qty 1

## 2021-03-08 MED ORDER — FAMOTIDINE IN NACL 20-0.9 MG/50ML-% IV SOLN
20.0000 mg | Freq: Once | INTRAVENOUS | Status: AC
Start: 2021-03-08 — End: 2021-03-08
  Administered 2021-03-08: 20 mg via INTRAVENOUS
  Filled 2021-03-08: qty 50

## 2021-03-08 NOTE — ED Triage Notes (Addendum)
Pt screaming out in waiting room due to abdominal pain. Reports she woke up with abdominal pain and vomiting. Incontinent episode of diarrhea. Drank a little bit of tequila last night. Also reports she had miscarriage in November Noted to have cut to lower lip due to fall last night

## 2021-03-08 NOTE — Discharge Instructions (Signed)
You were seen in the emergency department for nausea and vomiting.  Your lab work was unremarkable other than appearing somewhat dehydrated.  We are prescribing nausea medication to use as needed.  Please keep well-hydrated.  Return to the emergency department if any worsening or concerning symptoms

## 2021-03-08 NOTE — ED Notes (Signed)
Pt kept down sprite. Feels ready to go home

## 2021-03-08 NOTE — ED Provider Notes (Signed)
[facility] EMERGENCY DEPARTMENT Provider Note   CSN: [id number] Arrival date & time: 03/08/21  [id number]     History  Chief Complaint  Patient presents with   Abdominal Pain    Sherry Powell is a 26 y.o. female.  She has no significant past medical history.  Complaining of upper abdominal pain and vomiting that started around 4 AM this morning.  1 episode of diarrhea.  Did have a fall last evening and cut her lip on her braces.  Admits to drinking last night.  Denies any abdominal trauma.  Has tried nothing for it.  Does not think there was any blood in her vomitus.  No fevers chills.  No cough or shortness of breath.  States she had a miscarriage in November and has not had a period since then.  The history is provided by the patient.  Abdominal Pain Pain location:  Epigastric Pain quality: burning   Pain radiates to:  Chest Pain severity:  Severe Onset quality:  Sudden Duration:  5 hours Progression:  Unchanged Chronicity:  New Context: alcohol use   Relieved by:  None tried Worsened by:  Vomiting Ineffective treatments:  None tried Associated symptoms: chest pain, diarrhea, nausea and vomiting   Associated symptoms: no constipation, no cough, no dysuria, no fever, no hematemesis, no hematochezia, no hematuria, no shortness of breath, no sore throat and no vaginal bleeding       Home Medications Prior to Admission medications   Medication Sig Start Date End Date Taking? Authorizing Provider  acetaminophen (TYLENOL) 500 MG tablet Take 2 tablets (1,000 mg total) by mouth every 6 (six) hours as needed for mild pain. 06/27/19   Meuth, Brooke A, PA-C  cephALEXin (KEFLEX) 500 MG capsule Take 1 capsule (500 mg total) by mouth 2 (two) times daily. Patient not taking: No sig reported 01/25/15   Quintella Reichert, MD  cyclobenzaprine (FLEXERIL) 10 MG tablet Take 1 tablet (10 mg total) by mouth 2 (two) times daily as needed for muscle spasms. 01/29/16   Gareth Morgan, MD   dicyclomine (BENTYL) 20 MG tablet Take 1 tablet (20 mg total) by mouth 2 (two) times daily. 01/27/19   Tedd Sias, PA  gabapentin (NEURONTIN) 300 MG capsule Take 1 capsule (300 mg total) by mouth every 8 (eight) hours. 06/27/19   Meuth, Brooke A, PA-C  ibuprofen (ADVIL) 400 MG tablet Take 1 tablet (400 mg total) by mouth every 6 (six) hours as needed. 07/26/20   Palumbo, April, MD  ibuprofen (ADVIL,MOTRIN) 400 MG tablet Take 1 tablet (400 mg total) by mouth every 6 (six) hours as needed. 01/29/16   Gareth Morgan, MD  lidocaine (LIDODERM) 5 % Place 1 patch onto the skin daily. Remove & Discard patch within 12 hours or as directed by MD 12/07/20   Sherrill Raring, PA-C  methocarbamol (ROBAXIN) 500 MG tablet Take 1 tablet (500 mg total) by mouth every 6 (six) hours as needed for muscle spasms. 06/27/19   Meuth, Brooke A, PA-C  naproxen (NAPROSYN) 375 MG tablet Take 1 tablet (375 mg total) by mouth 2 (two) times daily. 12/07/20   Sherrill Raring, PA-C  ondansetron (ZOFRAN ODT) 4 MG disintegrating tablet Take 1 tablet (4 mg total) by mouth every 8 (eight) hours as needed for nausea or vomiting. 03/27/17   Khatri, Hina, PA-C  ondansetron (ZOFRAN) 4 MG tablet Take 1 tablet (4 mg total) by mouth every 6 (six) hours. 01/27/19   Tedd Sias, PA  oxyCODONE (OXY IR/ROXICODONE)  5 MG immediate release tablet Take 1-2 tablets (5-10 mg total) by mouth every 6 (six) hours as needed for moderate pain or severe pain (5mg  moderate, 10mg  severe). 06/27/19   Meuth, Brooke A, PA-C  polyethylene glycol (MIRALAX / GLYCOLAX) 17 g packet Take 17 g by mouth daily as needed for mild constipation. 06/27/19   Meuth, Blaine Hamper, PA-C      Allergies    Strawberry (diagnostic) and Strawberry flavor    Review of Systems   Review of Systems  Constitutional:  Negative for fever.  HENT:  Negative for sore throat.   Eyes:  Negative for visual disturbance.  Respiratory:  Negative for cough and shortness of breath.   Cardiovascular:   Positive for chest pain.  Gastrointestinal:  Positive for abdominal pain, diarrhea, nausea and vomiting. Negative for constipation, hematemesis and hematochezia.  Genitourinary:  Negative for dysuria, hematuria and vaginal bleeding.  Musculoskeletal:  Negative for neck pain.  Skin:  Positive for wound. Negative for rash.  Neurological:  Negative for headaches.   Physical Exam Updated Vital Signs LMP 01/02/2021 (Exact Date) Comment: miscarriage in November per patient   Breastfeeding Unknown  Physical Exam Vitals and nursing note reviewed.  Constitutional:      General: She is not in acute distress.    Appearance: Normal appearance. She is well-developed.  HENT:     Head: Normocephalic and atraumatic.     Mouth/Throat:     Mouth: Mucous membranes are moist.     Pharynx: Oropharynx is clear.     Comments: She has a macerated upper lip due to some dental trauma. Eyes:     Conjunctiva/sclera: Conjunctivae normal.  Cardiovascular:     Rate and Rhythm: Normal rate and regular rhythm.     Heart sounds: No murmur heard. Pulmonary:     Effort: Pulmonary effort is normal. No respiratory distress.     Breath sounds: Normal breath sounds.  Abdominal:     Palpations: Abdomen is soft.     Tenderness: There is no abdominal tenderness. There is no guarding or rebound.  Musculoskeletal:        General: No swelling.     Cervical back: Neck supple.  Skin:    General: Skin is warm and dry.     Capillary Refill: Capillary refill takes less than 2 seconds.  Neurological:     General: No focal deficit present.     Mental Status: She is alert.    ED Results / Procedures / Treatments   Labs (all labs ordered are listed, but only abnormal results are displayed) Labs Reviewed  COMPREHENSIVE METABOLIC PANEL - Abnormal; Notable for the following components:      Result Value   CO2 18 (*)    Glucose, Bld 125 (*)    All other components within normal limits  CBC WITH DIFFERENTIAL/PLATELET -  Abnormal; Notable for the following components:   WBC 11.4 (*)    Neutro Abs 9.7 (*)    All other components within normal limits  LIPASE, BLOOD - Abnormal; Notable for the following components:   Lipase 54 (*)    All other components within normal limits  HCG, SERUM, QUALITATIVE    EKG None  Radiology No results found.  Procedures Procedures    Medications Ordered in ED Medications  sodium chloride 0.9 % bolus 1,000 mL (has no administration in time range)  ondansetron (ZOFRAN) injection 4 mg (has no administration in time range)  famotidine (PEPCID) IVPB 20 mg premix (has  no administration in time range)  fentaNYL (SUBLIMAZE) injection 50 mcg (has no administration in time range)    ED Course/ Medical Decision Making/ A&P Clinical Course as of 03/08/21 Mikki Santee Mar 08, 2021  0927 Reassessment-patient resting quietly no complaints.  Lab work showing mildly elevated white count and low bicarb reflecting some dehydration.  Lipase mildly elevated. [MB]  [id number] Patient tolerating p.o. and feeling better.  Lab work consistent with dehydration but other than that do not feel needs to be admitted.  Return instructions discussed [MB]    Clinical Course User Index [MB] Hayden Rasmussen, MD                           Medical Decision Making  This patient complains of nausea and vomiting, abdominal pain, diarrhea in the setting of recent alcohol abuse; this involves an extensive number of treatment Options and is a complaint that carries with it a high risk of complications and Morbidity. The differential includes gastritis, peptic ulcer disease, alcohol intoxication, alcohol withdrawal, metabolic derangement  I ordered, reviewed and interpreted labs, which included CBC with mildly elevated white count stable hemoglobin, chemistries with low bicarbonate consistent with some dehydration, LFTs normal, lipase elevated, pregnancy test negative I ordered medication IV fluids and pain  medication nausea medication and acid medication Previous records obtained and reviewed in epic no recent admissions  After the interventions stated above, I reevaluated the patient and found patient be symptomatically improved.  She is tolerating p.o.  She has a ride home.  Return instructions discussed         Final Clinical Impression(s) / ED Diagnoses Final diagnoses:  Nausea and vomiting, unspecified vomiting type    Rx / DC Orders ED Discharge Orders          Ordered    ondansetron (ZOFRAN) 4 MG tablet  Every 6 hours        03/08/21 1004              Hayden Rasmussen, MD 03/08/21 1831

## 2021-03-08 NOTE — ED Notes (Signed)
Gave pt sprite 

## 2021-05-13 ENCOUNTER — Emergency Department (HOSPITAL_BASED_OUTPATIENT_CLINIC_OR_DEPARTMENT_OTHER)
Admission: EM | Admit: 2021-05-13 | Discharge: 2021-05-13 | Disposition: A | Discharge status: Home / Self Care | Payer: Medicaid Other | Attending: Emergency Medicine | Admitting: Emergency Medicine

## 2021-05-13 ENCOUNTER — Encounter (HOSPITAL_BASED_OUTPATIENT_CLINIC_OR_DEPARTMENT_OTHER): Payer: Self-pay

## 2021-05-13 ENCOUNTER — Other Ambulatory Visit: Payer: Self-pay

## 2021-05-13 DIAGNOSIS — H7292 Unspecified perforation of tympanic membrane, left ear: Secondary | ICD-10-CM | POA: Insufficient documentation

## 2021-05-13 DIAGNOSIS — H9202 Otalgia, left ear: Secondary | ICD-10-CM | POA: Diagnosis present

## 2021-05-13 MED ORDER — OFLOXACIN 0.3 % OT SOLN: 5.0000 [drp] | Freq: Two times a day (BID) | OTIC | 0 refills | Status: DC

## 2021-05-13 NOTE — ED Triage Notes (Signed)
Pt c/o left earache x 2 days-NAD-steady gait 

## 2021-05-13 NOTE — ED Provider Notes (Cosign Needed)
[facility] EMERGENCY DEPARTMENT Provider Note   CSN: [id number] Arrival date & time: 05/13/21  2037     History  Chief Complaint  Patient presents with   Otalgia    Sherry Powell is a 26 y.o. female presenting today with left ear pain over the last 2 days that was amplified today after sticking a Q-tip in it to relieve her symptoms.  States she saw pus at the end of the Q-tip after she pulled it out.  States that her hearing has changed but that she still able to hear.  Denies fever.  States she has allergies and has been having recent congestion and runny nose.  Denies sore throat.  Denies recent sick contacts.  Denies nausea/vomiting.  Denies recent visit to an outdoor pool or other body of water.  The history is provided by the patient and medical records.  Otalgia     Home Medications Prior to Admission medications   Medication Sig Start Date End Date Taking? Authorizing Provider  ofloxacin (FLOXIN) 0.3 % OTIC solution Place 5 drops into the left ear 2 (two) times daily. 05/13/21  Yes Cecil Cobbs, PA-C  acetaminophen (TYLENOL) 500 MG tablet Take 2 tablets (1,000 mg total) by mouth every 6 (six) hours as needed for mild pain. 06/27/19   Meuth, Brooke A, PA-C  cephALEXin (KEFLEX) 500 MG capsule Take 1 capsule (500 mg total) by mouth 2 (two) times daily. Patient not taking: No sig reported 01/25/15   Tilden Fossa, MD  cyclobenzaprine (FLEXERIL) 10 MG tablet Take 1 tablet (10 mg total) by mouth 2 (two) times daily as needed for muscle spasms. 01/29/16   Alvira Monday, MD  dicyclomine (BENTYL) 20 MG tablet Take 1 tablet (20 mg total) by mouth 2 (two) times daily. 01/27/19   Gailen Shelter, PA  gabapentin (NEURONTIN) 300 MG capsule Take 1 capsule (300 mg total) by mouth every 8 (eight) hours. 06/27/19   Meuth, Brooke A, PA-C  ibuprofen (ADVIL) 400 MG tablet Take 1 tablet (400 mg total) by mouth every 6 (six) hours as needed. 07/26/20   Palumbo, April, MD   ibuprofen (ADVIL,MOTRIN) 400 MG tablet Take 1 tablet (400 mg total) by mouth every 6 (six) hours as needed. 01/29/16   Alvira Monday, MD  lidocaine (LIDODERM) 5 % Place 1 patch onto the skin daily. Remove & Discard patch within 12 hours or as directed by MD 12/07/20   Theron Arista, PA-C  methocarbamol (ROBAXIN) 500 MG tablet Take 1 tablet (500 mg total) by mouth every 6 (six) hours as needed for muscle spasms. 06/27/19   Meuth, Brooke A, PA-C  naproxen (NAPROSYN) 375 MG tablet Take 1 tablet (375 mg total) by mouth 2 (two) times daily. 12/07/20   Theron Arista, PA-C  ondansetron (ZOFRAN ODT) 4 MG disintegrating tablet Take 1 tablet (4 mg total) by mouth every 8 (eight) hours as needed for nausea or vomiting. 03/27/17   Khatri, Hina, PA-C  ondansetron (ZOFRAN) 4 MG tablet Take 1 tablet (4 mg total) by mouth every 6 (six) hours. 03/08/21   Terrilee Files, MD  oxyCODONE (OXY IR/ROXICODONE) 5 MG immediate release tablet Take 1-2 tablets (5-10 mg total) by mouth every 6 (six) hours as needed for moderate pain or severe pain (5mg  moderate, 10mg  severe). 06/27/19   Meuth, Brooke A, PA-C  polyethylene glycol (MIRALAX / GLYCOLAX) 17 g packet Take 17 g by mouth daily as needed for mild constipation. 06/27/19   Meuth, 06/29/19, PA-C  Allergies    Strawberry (diagnostic) and Strawberry flavor    Review of Systems   Review of Systems  HENT:  Positive for ear pain (Left).    Physical Exam Updated Vital Signs BP (!) 140/99 (BP Location: Left Arm)    Pulse 95    Temp 98.2 F (36.8 C) (Oral)    Resp 18    Ht 5\' 5"  (1.651 m)    Wt 73.5 kg    LMP 05/10/2021    SpO2 99%    BMI 26.96 kg/m  Physical Exam Vitals and nursing note reviewed.  Constitutional:      General: She is not in acute distress.    Appearance: Normal appearance. She is well-developed. She is not ill-appearing or diaphoretic.  HENT:     Head: Normocephalic and atraumatic.     Right Ear: Ear canal and external ear normal. There is impacted  cerumen.     Left Ear: Ear canal and external ear normal. Tenderness present. There is no impacted cerumen. No foreign body. Tympanic membrane is perforated.     Nose: Congestion and rhinorrhea present. Rhinorrhea is clear.     Mouth/Throat:     Mouth: Mucous membranes are moist.     Pharynx: Oropharynx is clear. Uvula midline. No oropharyngeal exudate or posterior oropharyngeal erythema.  Eyes:     General: No scleral icterus.    Conjunctiva/sclera: Conjunctivae normal.  Cardiovascular:     Rate and Rhythm: Normal rate and regular rhythm.  Pulmonary:     Effort: Pulmonary effort is normal. No respiratory distress.  Musculoskeletal:        General: No swelling.     Cervical back: Neck supple.  Skin:    General: Skin is warm and dry.     Capillary Refill: Capillary refill takes less than 2 seconds.  Neurological:     Mental Status: She is alert and oriented to person, place, and time.  Psychiatric:        Mood and Affect: Mood normal.    ED Results / Procedures / Treatments   Labs (all labs ordered are listed, but only abnormal results are displayed) Labs Reviewed - No data to display  EKG None  Radiology No results found.  Procedures Procedures    Medications Ordered in ED Medications - No data to display  ED Course/ Medical Decision Making/ A&P                           Medical Decision Making Amount and/or Complexity of Data Reviewed External Data Reviewed: notes.  Risk OTC drugs. Prescription drug management.   26 y.o. female presents to the ED for concern of Otalgia  .  This involves an extensive number of treatment options, and is a complaint that carries with it a high risk of complications and morbidity.  The differential diagnosis includes otitis media, otitis externa, TM perforation/rupture  Additional history obtained from internal/external records available via epic  Interpretation:  Test Considered: None   Critical Interventions: None    Consultations Obtained: None  ED Course: Considered otitis media, but direct observations during physical exam and patient history not suggestive of this.  Considered otitis externa, mastoiditis and malignant otitis externa, but physical exam findings and patient history not supportive of this.  External ear appears normal.  No palpable lymphadenopathy or exam findings of the posterior ear to suggest significant infection.  Complaints localized to left ear.  Left TM rupture visualized during  physical exam.  Did not appreciate purulent discharge, erythema or discharge of canal.  Observed tenderness to the ear.  Patient states she observed purulent discharge when removing the cotton swab from her ear.  Believe it's reasonable to provide topical antibiotics/eardrops for the ear for treatment/prophylaxis.  Discussed importance of keeping area dry and provided methods to help do so.  Also encouraged use of OTC allergy medications for congestion reduction due to patient's described hay fever and regimen management with primary care.  Recommended close follow up with primary care and ENT for her left TM.  Patient agreeable to this.  Disposition: I discussed the patient and their case with my attending, Dr. Audley Hose, who agreed with the proposed treatment course.  After consideration of the diagnostic results and the patient's response to treatment, I feel that the patent would benefit from outpatient antibiotics and follow-up with ENT and primary care.  Discussed course of treatment thoroughly with the patient and she demonstrated understanding.  Patient in agreement and has no further questions.        Final Clinical Impression(s) / ED Diagnoses Final diagnoses:  Perforation of left tympanic membrane    Rx / DC Orders ED Discharge Orders          Ordered    ofloxacin (FLOXIN) 0.3 % OTIC solution  2 times daily        05/13/21 2203              Cecil Cobbs, [place] 05/14/21 1538

## 2021-05-13 NOTE — ED Notes (Signed)
Patient reports pain to L ear.  States she was attempting to "get my ear clean" with a Q-tip ?

## 2021-05-13 NOTE — Discharge Instructions (Addendum)
A prescription for antibiotic eardrops have been sent to the pharmacy as indicated below.  Use this twice a day to the affected ear. ? ?Do not allow water into the affected ear.  You may use a piece of a cottonball and apply this just into the ear to prevent water from entering.  Follow up with ENT and your primary care office.  The contact information for the ENT office has been provided for you below. ? ?Return to the ED for new or worsening symptoms as discussed ?

## 2021-07-15 ENCOUNTER — Emergency Department (HOSPITAL_BASED_OUTPATIENT_CLINIC_OR_DEPARTMENT_OTHER)
Admission: EM | Admit: 2021-07-15 | Discharge: 2021-07-15 | Disposition: A | Discharge status: Home / Self Care | Payer: Medicaid Other | Attending: Emergency Medicine | Admitting: Emergency Medicine

## 2021-07-15 ENCOUNTER — Other Ambulatory Visit: Payer: Self-pay

## 2021-07-15 ENCOUNTER — Encounter (HOSPITAL_BASED_OUTPATIENT_CLINIC_OR_DEPARTMENT_OTHER): Payer: Self-pay

## 2021-07-15 DIAGNOSIS — R109 Unspecified abdominal pain: Secondary | ICD-10-CM | POA: Diagnosis present

## 2021-07-15 DIAGNOSIS — R1115 Cyclical vomiting syndrome unrelated to migraine: Secondary | ICD-10-CM | POA: Insufficient documentation

## 2021-07-15 DIAGNOSIS — Z79899 Other long term (current) drug therapy: Secondary | ICD-10-CM | POA: Insufficient documentation

## 2021-07-15 LAB — COMPREHENSIVE METABOLIC PANEL
ALT: 13 U/L (ref 0–44)
AST: 21 U/L (ref 15–41)
Albumin: 4.1 g/dL (ref 3.5–5.0)
Alkaline Phosphatase: 52 U/L (ref 38–126)
Anion gap: 8 (ref 5–15)
BUN: 11 mg/dL (ref 6–20)
CO2: 24 mmol/L (ref 22–32)
Calcium: 9.5 mg/dL (ref 8.9–10.3)
Chloride: 106 mmol/L (ref 98–111)
Creatinine, Ser: 0.78 mg/dL (ref 0.44–1.00)
GFR, Estimated: >60 mL/min (ref >60)
Glucose, Bld: 112 mg/dL — ABNORMAL HIGH (ref 70–99)
Potassium: 3.6 mmol/L (ref 3.5–5.1)
Sodium: 138 mmol/L (ref 135–145)
Total Bilirubin: 0.8 mg/dL (ref 0.3–1.2)
Total Protein: 7.9 g/dL (ref 6.5–8.1)

## 2021-07-15 LAB — URINALYSIS, ROUTINE W REFLEX MICROSCOPIC
Bilirubin Urine: NEGATIVE
Glucose, UA: NEGATIVE mg/dL
Hgb urine dipstick: NEGATIVE
Ketones, ur: NEGATIVE mg/dL
Leukocytes,Ua: NEGATIVE
Nitrite: NEGATIVE
Protein, ur: NEGATIVE mg/dL
Specific Gravity, Urine: >=1.03 (ref 1.005–1.030)
pH: 5.5 (ref 5.0–8.0)

## 2021-07-15 LAB — CBC
HCT: 39.3 % (ref 36.0–46.0)
Hemoglobin: 12.9 g/dL (ref 12.0–15.0)
MCH: 29.3 pg (ref 26.0–34.0)
MCHC: 32.8 g/dL (ref 30.0–36.0)
MCV: 89.3 fL (ref 80.0–100.0)
Platelets: 294 10*3/uL (ref 150–400)
RBC: 4.4 MIL/uL (ref 3.87–5.11)
RDW: 13.7 % (ref 11.5–15.5)
WBC: 12.2 10*3/uL — ABNORMAL HIGH (ref 4.0–10.5)
nRBC: 0 % (ref 0.0–0.2)

## 2021-07-15 LAB — RAPID URINE DRUG SCREEN, HOSP PERFORMED
Amphetamines: NOT DETECTED
Barbiturates: NOT DETECTED
Benzodiazepines: NOT DETECTED
Cocaine: NOT DETECTED
Opiates: NOT DETECTED
Tetrahydrocannabinol: POSITIVE — AB

## 2021-07-15 LAB — PREGNANCY, URINE: Preg Test, Ur: NEGATIVE

## 2021-07-15 LAB — LIPASE, BLOOD: Lipase: 45 U/L (ref 11–51)

## 2021-07-15 MED ORDER — ONDANSETRON HCL 4 MG/2ML IJ SOLN
4.0000 mg | Freq: Once | INTRAMUSCULAR | Status: AC | PRN
  Administered 2021-07-15: 4 mg via INTRAVENOUS
  Filled 2021-07-15: qty 2

## 2021-07-15 MED ORDER — HALOPERIDOL LACTATE 5 MG/ML IJ SOLN
5.0000 mg | Freq: Once | INTRAMUSCULAR | Status: AC
  Administered 2021-07-15: 5 mg via INTRAVENOUS
  Filled 2021-07-15: qty 1

## 2021-07-15 MED ORDER — SODIUM CHLORIDE 0.9 % IV BOLUS
1000.0000 mL | Freq: Once | INTRAVENOUS | Status: AC
  Administered 2021-07-15: 1000 mL via INTRAVENOUS

## 2021-07-15 MED ORDER — DICYCLOMINE HCL 20 MG PO TABS: 20.0000 mg | ORAL_TABLET | Freq: Two times a day (BID) | ORAL | 0 refills | Status: DC

## 2021-07-15 MED ORDER — LIDOCAINE VISCOUS HCL 2 % MT SOLN
15.0000 mL | Freq: Once | OROMUCOSAL | Status: AC
Start: 2021-07-15 — End: 2021-07-15
  Administered 2021-07-15: 15 mL via ORAL
  Filled 2021-07-15: qty 15

## 2021-07-15 MED ORDER — ALUM & MAG HYDROXIDE-SIMETH 200-200-20 MG/5ML PO SUSP
30.0000 mL | Freq: Once | ORAL | Status: AC
  Administered 2021-07-15: 30 mL via ORAL
  Filled 2021-07-15: qty 30

## 2021-07-15 NOTE — Discharge Instructions (Addendum)
We saw you in the ER for severe abdominal pain, nausea, vomiting. ?All the results in the ER are normal -and your symptoms have also improved with medications in the ED ?We are not sure what is causing your symptoms. ?The workup in the ER is not complete, and is limited to screening for life threatening and emergent conditions only, so please see a primary care doctor for further evaluation. ? ?

## 2021-07-15 NOTE — ED Notes (Signed)
UA (PT Aware) ?

## 2021-07-15 NOTE — ED Notes (Addendum)
Pt screaming and writhing, no active vomiting, denies marijuanna use ?

## 2021-07-15 NOTE — ED Triage Notes (Signed)
Pt reports low abdominal pain and vomiting throughout night, denies any foods that may have contributed ?

## 2021-07-15 NOTE — ED Notes (Signed)
ED MD informed of abd pain and behavior in ED ?

## 2021-07-15 NOTE — ED Notes (Signed)
Pt aware of need for urine specimen, unable to provide at this time. 

## 2021-07-15 NOTE — ED Notes (Signed)
Pt reported relief from n/v following haldol and IV fluids.  Made aware of need for urine specimen. ?

## 2021-07-15 NOTE — ED Provider Notes (Signed)
?[facility] EMERGENCY DEPARTMENT ?Provider Note ? ? ?CSN: [id number] ?Arrival date & time: 07/15/21  0756 ? ?  ? ?History ? ?Chief Complaint  ?Patient presents with  ? Abdominal Pain  ?  vomiting  ? ? ?Sherry Powell is a 26 y.o. female. ? ?HPI ? ?  ? ?26 year old female comes in with chief complaint of abdominal pain. ? ?Patient indicates that she started having sudden onset abdominal pain with nausea and vomiting last night.  Her pain is in the lower quadrants, it is unbearable.  She has had similar episodes in the past, there is no specific trigger. ? ?Mom is at the bedside, given that patient is in a lot of distress she is also providing meaningful history. ? ?Patient denies any substance use disorder.  There is no known history of IBD.  Patient denies any bloody stools, bloody vomitus. ? ? ?Home Medications ?Prior to Admission medications   ?Medication Sig Start Date End Date Taking? Authorizing Provider  ?dicyclomine (BENTYL) 20 MG tablet Take 1 tablet (20 mg total) by mouth 2 (two) times daily. 07/15/21  Yes Derwood Kaplan, MD  ?acetaminophen (TYLENOL) 500 MG tablet Take 2 tablets (1,000 mg total) by mouth every 6 (six) hours as needed for mild pain. 06/27/19   Meuth, Lina Sar, PA-C  ?cephALEXin (KEFLEX) 500 MG capsule Take 1 capsule (500 mg total) by mouth 2 (two) times daily. ?Patient not taking: No sig reported 01/25/15   Tilden Fossa, MD  ?cyclobenzaprine (FLEXERIL) 10 MG tablet Take 1 tablet (10 mg total) by mouth 2 (two) times daily as needed for muscle spasms. 01/29/16   Alvira Monday, MD  ?gabapentin (NEURONTIN) 300 MG capsule Take 1 capsule (300 mg total) by mouth every 8 (eight) hours. 06/27/19   Meuth, Brooke A, PA-C  ?ibuprofen (ADVIL) 400 MG tablet Take 1 tablet (400 mg total) by mouth every 6 (six) hours as needed. 07/26/20   Palumbo, April, MD  ?ibuprofen (ADVIL,MOTRIN) 400 MG tablet Take 1 tablet (400 mg total) by mouth every 6 (six) hours as needed. 01/29/16   Alvira Monday, MD  ?lidocaine (LIDODERM) 5 % Place 1 patch onto the skin daily. Remove & Discard patch within 12 hours or as directed by MD 12/07/20   Theron Arista, PA-C  ?methocarbamol (ROBAXIN) 500 MG tablet Take 1 tablet (500 mg total) by mouth every 6 (six) hours as needed for muscle spasms. 06/27/19   Meuth, Brooke A, PA-C  ?naproxen (NAPROSYN) 375 MG tablet Take 1 tablet (375 mg total) by mouth 2 (two) times daily. 12/07/20   Theron Arista, PA-C  ?ofloxacin (FLOXIN) 0.3 % OTIC solution Place 5 drops into the left ear 2 (two) times daily. 05/13/21   Cecil Cobbs, PA-C  ?ondansetron (ZOFRAN ODT) 4 MG disintegrating tablet Take 1 tablet (4 mg total) by mouth every 8 (eight) hours as needed for nausea or vomiting. 03/27/17   Khatri, Hina, PA-C  ?ondansetron (ZOFRAN) 4 MG tablet Take 1 tablet (4 mg total) by mouth every 6 (six) hours. 03/08/21   Terrilee Files, MD  ?oxyCODONE (OXY IR/ROXICODONE) 5 MG immediate release tablet Take 1-2 tablets (5-10 mg total) by mouth every 6 (six) hours as needed for moderate pain or severe pain (5mg  moderate, 10mg  severe). 06/27/19   Meuth, Brooke A, PA-C  ?polyethylene glycol (MIRALAX / GLYCOLAX) 17 g packet Take 17 g by mouth daily as needed for mild constipation. 06/27/19   Meuth, 06/29/19, PA-C  ?   ? ?Allergies    ?  Strawberry (diagnostic) and Strawberry flavor   ? ?Review of Systems   ?Review of Systems  ?All other systems reviewed and are negative. ? ?Physical Exam ?Updated Vital Signs ?BP (!) 147/103   Pulse 90   Temp 97.8 ?F (36.6 ?C) (Oral)   Resp 18   Ht 5\' 6"  (1.676 m)   Wt 74.8 kg   SpO2 100%   BMI 26.63 kg/m?  ?Physical Exam ?Vitals and nursing note reviewed.  ?Constitutional:   ?   General: She is in acute distress.  ?   Appearance: She is well-developed. She is not toxic-appearing or diaphoretic.  ?HENT:  ?   Head: Atraumatic.  ?Cardiovascular:  ?   Rate and Rhythm: Normal rate.  ?Pulmonary:  ?   Effort: Pulmonary effort is normal.  ?Abdominal:  ?   General: Abdomen is  flat.  ?   Tenderness: There is abdominal tenderness.  ?Musculoskeletal:  ?   Cervical back: Normal range of motion and neck supple.  ?Skin: ?   General: Skin is warm and dry.  ?Neurological:  ?   Mental Status: She is alert and oriented to person, place, and time.  ? ? ?ED Results / Procedures / Treatments   ?Labs ?(all labs ordered are listed, but only abnormal results are displayed) ?Labs Reviewed  ?COMPREHENSIVE METABOLIC PANEL - Abnormal; Notable for the following components:  ?    Result Value  ? Glucose, Bld 112 (*)   ? All other components within normal limits  ?CBC - Abnormal; Notable for the following components:  ? WBC 12.2 (*)   ? All other components within normal limits  ?URINALYSIS, ROUTINE W REFLEX MICROSCOPIC - Abnormal; Notable for the following components:  ? APPearance CLOUDY (*)   ? All other components within normal limits  ?LIPASE, BLOOD  ?PREGNANCY, URINE  ?RAPID URINE DRUG SCREEN, HOSP PERFORMED  ? ? ?EKG ?None ? ?Radiology ?No results found. ? ?Procedures ?Procedures  ? ? ?Medications Ordered in ED ?Medications  ?ondansetron (ZOFRAN) injection 4 mg (4 mg Intravenous Given 07/15/21 0823)  ?haloperidol lactate (HALDOL) injection 5 mg (5 mg Intravenous Given 07/15/21 0911)  ?sodium chloride 0.9 % bolus 1,000 mL (1,000 mLs Intravenous New Bag/Given 07/15/21 0911)  ?alum & mag hydroxide-simeth (MAALOX/MYLANTA) 200-200-20 MG/5ML suspension 30 mL (30 mLs Oral Given 07/15/21 0946)  ?  And  ?lidocaine (XYLOCAINE) 2 % viscous mouth solution 15 mL (15 mLs Oral Given 07/15/21 0946)  ? ? ?ED Course/ Medical Decision Making/ A&P ?  ?                        ?Medical Decision Making ?Amount and/or Complexity of Data Reviewed ?Labs: ordered. ? ?Risk ?OTC drugs. ?Prescription drug management. ? ? ?This patient presents to the ED with chief complaint(s) of severe abdominal pain, nausea, vomiting with pertinent past medical history of similar episodes in the past that come about unprovoked which further complicates  the presenting complaint. The complaint involves an extensive differential diagnosis and also carries with it a high risk of complications and morbidity.   ? ?The differential diagnosis includes cyclic vomiting think syndrome, cannabinoid hyperemesis syndrome, small bowel obstruction, ovarian torsion, kidney stone ? ?I reviewed patient's work-up.  She has been seen in the past with similar sudden onset, abrupt pain [facility]  She improved with Haldol at that time. ? ?The initial plan is to order basic labs and give patient Haldol.  Patient will be  promptly reassessed, if she is not feeling better then we will proceed with ultrasound torsion and further evaluate for kidney stone. ? ? ?Additional history obtained: ?Additional history obtained from family ?Records reviewed Care Everywhere/External Records ? ?Independent labs interpretation:  ?The following labs were independently interpreted: Patient's lab work-up is reassuring.  Positive for THC.  Negative for significant elevation in white count or abnormality and metabolic profile.  No ketones in the urine. ? ? ?Treatment and Reassessment: ?Patient had received IV Haldol.  She feels better, she is relaxing.  No pain right now.  Discussed with them that most likely the pain were mediated by spasm.  Could be hyperemesis cannabinoid or cyclic vomiting syndrome.  Low suspicion for other possibilities at this time given dramatic response to Haldol. ? ?Labs are reassuring as well.  Stable for discharge. ? ? ? ?Final Clinical Impression(s) / ED Diagnoses ?Final diagnoses:  ?Cyclic vomiting syndrome  ? ? ?Rx / DC Orders ?ED Discharge Orders   ? ?      Ordered  ?  dicyclomine (BENTYL) 20 MG tablet  2 times daily       ? 07/15/21 1027  ? ?  ?  ? ?  ? ? ?  ?Derwood Kaplan, MD ?07/15/21 1040 ? ?

## 2021-08-29 ENCOUNTER — Encounter (HOSPITAL_BASED_OUTPATIENT_CLINIC_OR_DEPARTMENT_OTHER): Payer: Self-pay | Admitting: Emergency Medicine

## 2021-08-29 ENCOUNTER — Other Ambulatory Visit: Payer: Self-pay

## 2021-08-29 ENCOUNTER — Emergency Department (HOSPITAL_BASED_OUTPATIENT_CLINIC_OR_DEPARTMENT_OTHER)
Admission: EM | Admit: 2021-08-29 | Discharge: 2021-08-29 | Disposition: A | Discharge status: Home / Self Care | Payer: Medicaid Other | Attending: Emergency Medicine | Admitting: Emergency Medicine

## 2021-08-29 DIAGNOSIS — R1115 Cyclical vomiting syndrome unrelated to migraine: Secondary | ICD-10-CM | POA: Insufficient documentation

## 2021-08-29 DIAGNOSIS — R112 Nausea with vomiting, unspecified: Secondary | ICD-10-CM | POA: Diagnosis present

## 2021-08-29 DIAGNOSIS — N939 Abnormal uterine and vaginal bleeding, unspecified: Secondary | ICD-10-CM | POA: Diagnosis not present

## 2021-08-29 LAB — CBC WITH DIFFERENTIAL/PLATELET
Abs Immature Granulocytes: 0.04 10*3/uL (ref 0.00–0.07)
Basophils Absolute: 0.1 10*3/uL (ref 0.0–0.1)
Basophils Relative: 1 %
Eosinophils Absolute: 0.2 10*3/uL (ref 0.0–0.5)
Eosinophils Relative: 2 %
HCT: 38 % (ref 36.0–46.0)
Hemoglobin: 12.6 g/dL (ref 12.0–15.0)
Immature Granulocytes: 1 %
Lymphocytes Relative: 35 %
Lymphs Abs: 2.6 10*3/uL (ref 0.7–4.0)
MCH: 29.5 pg (ref 26.0–34.0)
MCHC: 33.2 g/dL (ref 30.0–36.0)
MCV: 89 fL (ref 80.0–100.0)
Monocytes Absolute: 0.6 10*3/uL (ref 0.1–1.0)
Monocytes Relative: 9 %
Neutro Abs: 4 10*3/uL (ref 1.7–7.7)
Neutrophils Relative %: 52 %
Platelets: 312 10*3/uL (ref 150–400)
RBC: 4.27 MIL/uL (ref 3.87–5.11)
RDW: 14.2 % (ref 11.5–15.5)
WBC: 7.6 10*3/uL (ref 4.0–10.5)
nRBC: 0 % (ref 0.0–0.2)

## 2021-08-29 LAB — COMPREHENSIVE METABOLIC PANEL
ALT: 18 U/L (ref 0–44)
AST: 28 U/L (ref 15–41)
Albumin: 4.1 g/dL (ref 3.5–5.0)
Alkaline Phosphatase: 50 U/L (ref 38–126)
Anion gap: 6 (ref 5–15)
BUN: 10 mg/dL (ref 6–20)
CO2: 21 mmol/L — ABNORMAL LOW (ref 22–32)
Calcium: 9.3 mg/dL (ref 8.9–10.3)
Chloride: 111 mmol/L (ref 98–111)
Creatinine, Ser: 0.71 mg/dL (ref 0.44–1.00)
GFR, Estimated: >60 mL/min (ref >60)
Glucose, Bld: 128 mg/dL — ABNORMAL HIGH (ref 70–99)
Potassium: 4.2 mmol/L (ref 3.5–5.1)
Sodium: 138 mmol/L (ref 135–145)
Total Bilirubin: 0.8 mg/dL (ref 0.3–1.2)
Total Protein: 8.1 g/dL (ref 6.5–8.1)

## 2021-08-29 LAB — URINALYSIS, ROUTINE W REFLEX MICROSCOPIC
Bilirubin Urine: NEGATIVE
Glucose, UA: NEGATIVE mg/dL
Ketones, ur: NEGATIVE mg/dL
Leukocytes,Ua: NEGATIVE
Nitrite: NEGATIVE
Protein, ur: NEGATIVE mg/dL
Specific Gravity, Urine: >=1.03 (ref 1.005–1.030)
pH: 7 (ref 5.0–8.0)

## 2021-08-29 LAB — URINALYSIS, MICROSCOPIC (REFLEX): RBC / HPF: >50 RBC/hpf (ref 0–5)

## 2021-08-29 LAB — PREGNANCY, URINE: Preg Test, Ur: NEGATIVE

## 2021-08-29 LAB — LIPASE, BLOOD: Lipase: 76 U/L — ABNORMAL HIGH (ref 11–51)

## 2021-08-29 MED ORDER — FAMOTIDINE IN NACL 20-0.9 MG/50ML-% IV SOLN
20.0000 mg | Freq: Once | INTRAVENOUS | Status: AC
  Administered 2021-08-29: 20 mg via INTRAVENOUS
  Filled 2021-08-29: qty 50

## 2021-08-29 MED ORDER — HALOPERIDOL LACTATE 5 MG/ML IJ SOLN
10.0000 mg | Freq: Once | INTRAMUSCULAR | Status: DC
  Filled 2021-08-29: qty 2

## 2021-08-29 MED ORDER — ONDANSETRON HCL 4 MG PO TABS: 4.0000 mg | ORAL_TABLET | Freq: Four times a day (QID) | ORAL | 0 refills | Status: DC

## 2021-08-29 MED ORDER — METOCLOPRAMIDE HCL 5 MG/ML IJ SOLN
10.0000 mg | Freq: Once | INTRAMUSCULAR | Status: AC
  Administered 2021-08-29: 10 mg via INTRAVENOUS
  Filled 2021-08-29: qty 2

## 2021-08-29 MED ORDER — PROMETHAZINE HCL 25 MG RE SUPP: 25.0000 mg | Freq: Four times a day (QID) | RECTAL | 0 refills | Status: DC | PRN

## 2021-08-29 MED ORDER — MORPHINE SULFATE (PF) 2 MG/ML IV SOLN
2.0000 mg | Freq: Once | INTRAVENOUS | Status: AC
  Administered 2021-08-29: 2 mg via INTRAVENOUS
  Filled 2021-08-29: qty 1

## 2021-08-29 MED ORDER — HALOPERIDOL LACTATE 5 MG/ML IJ SOLN
5.0000 mg | Freq: Once | INTRAMUSCULAR | Status: AC
Start: 2021-08-29 — End: 2021-08-29
  Administered 2021-08-29: 5 mg via INTRAVENOUS

## 2021-11-14 ENCOUNTER — Emergency Department (HOSPITAL_BASED_OUTPATIENT_CLINIC_OR_DEPARTMENT_OTHER)
Admission: EM | Admit: 2021-11-14 | Discharge: 2021-11-14 | Disposition: A | Discharge status: Home / Self Care | Payer: Medicaid Other | Attending: Emergency Medicine | Admitting: Emergency Medicine

## 2021-11-14 ENCOUNTER — Other Ambulatory Visit: Payer: Self-pay

## 2021-11-14 ENCOUNTER — Emergency Department (HOSPITAL_BASED_OUTPATIENT_CLINIC_OR_DEPARTMENT_OTHER): Payer: Medicaid Other

## 2021-11-14 ENCOUNTER — Encounter (HOSPITAL_BASED_OUTPATIENT_CLINIC_OR_DEPARTMENT_OTHER): Payer: Self-pay | Admitting: Urology

## 2021-11-14 DIAGNOSIS — Y92129 Unspecified place in nursing home as the place of occurrence of the external cause: Secondary | ICD-10-CM | POA: Insufficient documentation

## 2021-11-14 DIAGNOSIS — S40012A Contusion of left shoulder, initial encounter: Secondary | ICD-10-CM | POA: Diagnosis not present

## 2021-11-14 DIAGNOSIS — W1830XA Fall on same level, unspecified, initial encounter: Secondary | ICD-10-CM | POA: Diagnosis not present

## 2021-11-14 DIAGNOSIS — S4992XA Unspecified injury of left shoulder and upper arm, initial encounter: Secondary | ICD-10-CM | POA: Diagnosis present

## 2021-11-14 NOTE — ED Notes (Signed)
Written and verbal inst to pt  Verbalized an understanding  To home  

## 2021-11-14 NOTE — ED Triage Notes (Signed)
Pt states fell on left arm/shoulder last week  States now is having worsening pain States unable to do her job due to pain and limitied ROM in left shoulder

## 2021-11-14 NOTE — ED Provider Notes (Signed)
   [facility] EMERGENCY DEPARTMENT  Provider Note  CSN: [id number] Arrival date & time: 11/14/21 0005  History Chief Complaint  Patient presents with   Shoulder Injury    Sherry Powell is a 26 y.o. female reports she fell about a week ago injuring her L shoulder, she has had pain since then, not improving much with motrin. 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    Home Medications Prior to Admission medications   Medication Sig Start Date End Date Taking? Authorizing Provider  acetaminophen (TYLENOL) 500 MG tablet Take 2 tablets (1,000 mg total) by mouth every 6 (six) hours as needed for mild pain. 06/27/19   Meuth, Brooke A, PA-C  ibuprofen (ADVIL) 400 MG tablet Take 1 tablet (400 mg total) by mouth every 6 (six) hours as needed. 07/26/20   Palumbo, April, MD     Allergies    Strawberry (diagnostic) and Strawberry flavor   Review of Systems   Review of Systems Please see HPI for pertinent positives and negatives  Physical Exam BP 118/75 (BP Location: Right Arm)   Pulse 83   Temp 98.2 F (36.8 C) (Oral)   Resp 16   Ht 5\' 6"  (1.676 m)   Wt 72.6 kg   LMP 10/21/2021 (Exact Date)   SpO2 100%   BMI 25.82 kg/m   Physical Exam Vitals and nursing note reviewed.  HENT:     Head: Normocephalic.     Nose: Nose normal.  Eyes:     Extraocular Movements: Extraocular movements intact.  Pulmonary:     Effort: Pulmonary effort is normal.  Musculoskeletal:        General: Tenderness (L lateral deltoid, no bony tenderness) present. No swelling or deformity. Normal range of motion.     Cervical back: Neck supple.  Skin:    Findings: No rash (on exposed skin).  Neurological:     Mental Status: She is alert and oriented to person, place, and time.  Psychiatric:        Mood and Affect: Mood normal.     ED Results / Procedures / Treatments   EKG None  Procedures Procedures  Medications Ordered in the ED Medications  - No data to display  Initial Impression and Plan  Patient with L shoulder pain after fall. I personally viewed the images from radiology studies and agree with radiologist interpretation: Xray is neg for acute fracture. Recommend she continue with motrin, avoid heavy lifting and follow up with Ortho.    ED Course       MDM Rules/Calculators/A&P Medical Decision Making Problems Addressed: Contusion of left shoulder, initial encounter: acute illness or injury  Amount and/or Complexity of Data Reviewed Radiology: ordered and independent interpretation performed. Decision-making details documented in ED Course.  Risk OTC drugs.    Final Clinical Impression(s) / ED Diagnoses Final diagnoses:  Contusion of left shoulder, initial encounter    Rx / DC Orders ED Discharge Orders     None        10/23/2021, MD 11/14/21 [phone]

## 2022-01-23 ENCOUNTER — Emergency Department (HOSPITAL_BASED_OUTPATIENT_CLINIC_OR_DEPARTMENT_OTHER)
Admission: EM | Admit: 2022-01-23 | Discharge: 2022-01-23 | Disposition: A | Discharge status: Home / Self Care | Payer: Medicaid Other | Attending: Emergency Medicine | Admitting: Emergency Medicine

## 2022-01-23 ENCOUNTER — Encounter (HOSPITAL_BASED_OUTPATIENT_CLINIC_OR_DEPARTMENT_OTHER): Payer: Self-pay | Admitting: Emergency Medicine

## 2022-01-23 DIAGNOSIS — R1030 Lower abdominal pain, unspecified: Secondary | ICD-10-CM

## 2022-01-23 DIAGNOSIS — R1012 Left upper quadrant pain: Secondary | ICD-10-CM | POA: Diagnosis not present

## 2022-01-23 DIAGNOSIS — J45909 Unspecified asthma, uncomplicated: Secondary | ICD-10-CM | POA: Insufficient documentation

## 2022-01-23 DIAGNOSIS — R1031 Right lower quadrant pain: Secondary | ICD-10-CM | POA: Diagnosis not present

## 2022-01-23 DIAGNOSIS — R112 Nausea with vomiting, unspecified: Secondary | ICD-10-CM

## 2022-01-23 DIAGNOSIS — R1032 Left lower quadrant pain: Secondary | ICD-10-CM | POA: Diagnosis not present

## 2022-01-23 HISTORY — DX: Cyclical vomiting syndrome unrelated to migraine: R11.15

## 2022-01-23 LAB — COMPREHENSIVE METABOLIC PANEL WITH GFR
ALT: 12 U/L (ref 0–44)
AST: 21 U/L (ref 15–41)
Albumin: 3.8 g/dL (ref 3.5–5.0)
Alkaline Phosphatase: 46 U/L (ref 38–126)
Anion gap: 5 (ref 5–15)
BUN: 7 mg/dL (ref 6–20)
CO2: 24 mmol/L (ref 22–32)
Calcium: 9 mg/dL (ref 8.9–10.3)
Chloride: 110 mmol/L (ref 98–111)
Creatinine, Ser: 0.74 mg/dL (ref 0.44–1.00)
GFR, Estimated: >60 mL/min
Glucose, Bld: 106 mg/dL — ABNORMAL HIGH (ref 70–99)
Potassium: 3.9 mmol/L (ref 3.5–5.1)
Sodium: 139 mmol/L (ref 135–145)
Total Bilirubin: 0.7 mg/dL (ref 0.3–1.2)
Total Protein: 7.7 g/dL (ref 6.5–8.1)

## 2022-01-23 LAB — CBC WITH DIFFERENTIAL/PLATELET
Abs Immature Granulocytes: 0.03 K/uL (ref 0.00–0.07)
Basophils Absolute: 0 K/uL (ref 0.0–0.1)
Basophils Relative: 0 %
Eosinophils Absolute: 0.3 K/uL (ref 0.0–0.5)
Eosinophils Relative: 4 %
HCT: 36.8 % (ref 36.0–46.0)
Hemoglobin: 11.8 g/dL — ABNORMAL LOW (ref 12.0–15.0)
Immature Granulocytes: 0 %
Lymphocytes Relative: 34 %
Lymphs Abs: 3.1 K/uL (ref 0.7–4.0)
MCH: 28.6 pg (ref 26.0–34.0)
MCHC: 32.1 g/dL (ref 30.0–36.0)
MCV: 89.1 fL (ref 80.0–100.0)
Monocytes Absolute: 0.8 K/uL (ref 0.1–1.0)
Monocytes Relative: 8 %
Neutro Abs: 4.9 K/uL (ref 1.7–7.7)
Neutrophils Relative %: 54 %
Platelets: 351 K/uL (ref 150–400)
RBC: 4.13 MIL/uL (ref 3.87–5.11)
RDW: 13 % (ref 11.5–15.5)
WBC: 9.1 K/uL (ref 4.0–10.5)
nRBC: 0 % (ref 0.0–0.2)

## 2022-01-23 LAB — LIPASE, BLOOD: Lipase: 55 U/L — ABNORMAL HIGH (ref 11–51)

## 2022-01-23 MED ORDER — HALOPERIDOL LACTATE 5 MG/ML IJ SOLN
5.0000 mg | Freq: Once | INTRAMUSCULAR | Status: AC
Start: 2022-01-23 — End: 2022-01-23
  Administered 2022-01-23: 5 mg via INTRAVENOUS
  Filled 2022-01-23: qty 1

## 2022-01-23 MED ORDER — LACTATED RINGERS IV BOLUS
1000.0000 mL | Freq: Once | INTRAVENOUS | Status: AC
  Administered 2022-01-23: 1000 mL via INTRAVENOUS

## 2022-01-23 NOTE — ED Triage Notes (Signed)
Crying, states," My stomach hurts" Abd pain and n/v since last night. Last smoked weed x 2 days ago

## 2022-01-23 NOTE — ED Notes (Signed)
ED Provider at bedside. 

## 2022-01-23 NOTE — ED Notes (Signed)
Heard IV pump beeping. Went in room. Nobody was in room and IV was hanging from IV pump.

## 2022-01-23 NOTE — ED Provider Notes (Signed)
[facility] EMERGENCY DEPARTMENT Provider Note   CSN: [id number] Arrival date & time: 01/23/22  0751     History  Chief Complaint  Patient presents with   Abdominal Pain    Massie Cogliano is a 26 y.o. female.  HPI     26yo female with history of asthma, cyclic vomiting and cannabinoid hyperemesis who presents with concern for abdominal pain, nausea and vomiting.  Symptoms began at 10PM last night, sudden onset of pain and vomiting. Stabbing pain throughout lower abdomen, not worse on one side or other, and more episodes of emesis than she can count. Feels the same as prior pain/vomiting episodes that have brought her to the ED.  Last BM 2 days ago.  No diarrhea, urinary symptoms, vaginal discharge/bleeding. LMP 11/1.  No fever, chills.  Tearful and nauseas.  No hx of ovarian cysts.  Last used marijuana 2 days ago.  Past Medical History:  Diagnosis Date   Asthma    Cyclic vomiting syndrome     Past Surgical History:  Procedure Laterality Date   CESAREAN SECTION     FOOT SURGERY      Home Medications Prior to Admission medications   Medication Sig Start Date End Date Taking? Authorizing Provider  acetaminophen (TYLENOL) 500 MG tablet Take 2 tablets (1,000 mg total) by mouth every 6 (six) hours as needed for mild pain. 06/27/19   Meuth, Brooke A, PA-C  ibuprofen (ADVIL) 400 MG tablet Take 1 tablet (400 mg total) by mouth every 6 (six) hours as needed. 07/26/20   Palumbo, April, MD      Allergies    Strawberry (diagnostic) and Strawberry flavor    Review of Systems   Review of Systems  Physical Exam Updated Vital Signs BP (!) 143/117   Pulse 93   Temp 98 F (36.7 C) (Oral)   Resp (!) 22   Ht 5\' 5"  (1.651 m)   Wt 79.4 kg   LMP 01/06/2022   SpO2 100%   BMI 29.12 kg/m  Physical Exam Vitals and nursing note reviewed.  Constitutional:      General: She is in acute distress (pain, vomiting).     Appearance: Normal appearance. She is not  ill-appearing, toxic-appearing or diaphoretic.  HENT:     Head: Normocephalic.  Eyes:     Conjunctiva/sclera: Conjunctivae normal.  Cardiovascular:     Rate and Rhythm: Normal rate and regular rhythm.     Pulses: Normal pulses.  Pulmonary:     Effort: Pulmonary effort is normal. No respiratory distress.  Abdominal:     General: Abdomen is flat. There is no distension.     Palpations: Abdomen is soft.     Tenderness: There is abdominal tenderness in the right lower quadrant, suprapubic area, left upper quadrant and left lower quadrant. There is no guarding.     Hernia: No hernia is present.  Musculoskeletal:        General: No deformity or signs of injury.     Cervical back: No rigidity.  Skin:    General: Skin is warm and dry.     Coloration: Skin is not jaundiced or pale.  Neurological:     General: No focal deficit present.     Mental Status: She is alert and oriented to person, place, and time.     ED Results / Procedures / Treatments   Labs (all labs ordered are listed, but only abnormal results are displayed) Labs Reviewed  CBC WITH DIFFERENTIAL/PLATELET - Abnormal; Notable  for the following components:      Result Value   Hemoglobin 11.8 (*)    All other components within normal limits  COMPREHENSIVE METABOLIC PANEL - Abnormal; Notable for the following components:   Glucose, Bld 106 (*)    All other components within normal limits  LIPASE, BLOOD - Abnormal; Notable for the following components:   Lipase 55 (*)    All other components within normal limits  PREGNANCY, URINE  URINALYSIS, ROUTINE W REFLEX MICROSCOPIC  RAPID URINE DRUG SCREEN, HOSP PERFORMED    EKG None  Radiology No results found.  Procedures Procedures    Medications Ordered in ED Medications  lactated ringers bolus 1,000 mL (0 mLs Intravenous Stopped 01/23/22 0844)  haloperidol lactate (HALDOL) injection 5 mg (5 mg Intravenous Given 01/23/22 2202)    ED Course/ Medical Decision  Making/ A&P                            26yo female with history of asthma, cyclic vomiting and cannabinoid hyperemesis who presents with concern for abdominal pain, nausea and vomiting.   DDx includes appendicitis, pancreatitis, cholecystitis, pyelonephritis, nephrolithiasis, diverticulitis, PID, ovarian torsion, ectopic pregnancy, and tuboovarian abscess.  Reports symptoms similar to prior episodes of pain and vomiting. Exam nonfocal with pain not localizing to one side and clinically have lower suspicion for diverticulitis, cholecystitis, appendicitis, torsion.  Labs obtained and evaluated by me personally show, no significant electrolyte abnormalities, no sign of pancreatitis or hepatitis (resulted after she eloped).  She was given IV fluids, haldol.  Not long after receiving the medication she pulled out her IV and left.  Per nursing she did not appear in any acute distress, vomiting had improved.     Unfortunately, she eloped prior to giving a urine sample and I am unable to rule out other emergent diagnoses such as ectopic pregnancy, urinary tract infection and was not able to evaluate for continued pain to consider other emergent pathology.  Attempted to call patient after elopement but she did not answer.         Final Clinical Impression(s) / ED Diagnoses Final diagnoses:  Lower abdominal pain  Nausea and vomiting, unspecified vomiting type    Rx / DC Orders ED Discharge Orders     None         Alvira Monday, MD 01/23/22 [id number]

## 2022-01-23 NOTE — ED Notes (Signed)
Pt refusing cardiac monitoring. 

## 2022-01-23 NOTE — ED Notes (Addendum)
Pt called out and said she wants to leave AMA. EDP informed. Checked on pt. Pt's nausea decreased. Pt had pulled off BP cuff and SpO2 monitor. Pt alert and oriented. NAD. Family at bedside. Advised pt to say for EDP to speak with her. Pt voiced no opposition to plan. Gave warm blanket.

## 2022-09-05 ENCOUNTER — Other Ambulatory Visit: Payer: Self-pay

## 2022-09-05 ENCOUNTER — Emergency Department (HOSPITAL_BASED_OUTPATIENT_CLINIC_OR_DEPARTMENT_OTHER)
Admission: EM | Admit: 2022-09-05 | Discharge: 2022-09-05 | Discharge status: Against Medical Advice | Payer: Medicaid Other

## 2022-09-05 NOTE — ED Notes (Signed)
Pt called for triage. Per family member, pt had returned to car due to not wanting to wait to be seen at this time.
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
